# Patient Record
Sex: Female | Born: 1990
Health system: Southern US, Community
[De-identification: ages and names within clinical notes are randomized; demographics above are authoritative.]

## PROBLEM LIST (undated history)

## (undated) DIAGNOSIS — T7840XA Allergy, unspecified, initial encounter: Secondary | ICD-10-CM

## (undated) DIAGNOSIS — Z789 Other specified health status: Secondary | ICD-10-CM

## (undated) DIAGNOSIS — O139 Gestational [pregnancy-induced] hypertension without significant proteinuria, unspecified trimester: Secondary | ICD-10-CM

## (undated) DIAGNOSIS — F419 Anxiety disorder, unspecified: Secondary | ICD-10-CM

## (undated) HISTORY — PX: TONSILLECTOMY: SUR1361

## (undated) HISTORY — DX: Gestational (pregnancy-induced) hypertension without significant proteinuria, unspecified trimester: O13.9

## (undated) HISTORY — PX: WISDOM TOOTH EXTRACTION: SHX21

## (undated) HISTORY — DX: Allergy, unspecified, initial encounter: T78.40XA

---

## 2013-01-26 ENCOUNTER — Ambulatory Visit (INDEPENDENT_AMBULATORY_CARE_PROVIDER_SITE_OTHER): Payer: 59 | Admitting: Family Medicine

## 2013-01-26 ENCOUNTER — Ambulatory Visit: Payer: 59

## 2013-01-26 VITALS — BP 130/82 | HR 108 | Temp 98.7°F | Resp 16 | Ht 64.58 in | Wt 130.0 lb

## 2013-01-26 DIAGNOSIS — R05 Cough: Secondary | ICD-10-CM

## 2013-01-26 DIAGNOSIS — R000 Tachycardia, unspecified: Secondary | ICD-10-CM

## 2013-01-26 LAB — POCT CBC
HCT, POC: 43 % (ref 37.7–47.9)
Hemoglobin: 13.5 g/dL (ref 12.2–16.2)
MCH, POC: 27.8 pg (ref 27–31.2)
MCV: 88.7 fL (ref 80–97)
RBC: 4.85 M/uL (ref 4.04–5.48)
WBC: 6.6 10*3/uL (ref 4.6–10.2)

## 2013-01-26 MED ORDER — AZITHROMYCIN 250 MG PO TABS: ORAL_TABLET | ORAL | Status: DC

## 2013-01-26 MED ORDER — HYDROCODONE-HOMATROPINE 5-1.5 MG/5ML PO SYRP: 5.0000 mL | ORAL_SOLUTION | Freq: Three times a day (TID) | ORAL | Status: DC | PRN

## 2013-01-26 NOTE — Patient Instructions (Addendum)
I will be in touch with you regarding your D dimer test when it comes in later on this evening. If it is positive we will need to do a CT of your chest through the emergency department.    Assuming your D dimer is negative, we will plan to treat you with azithromycin (antibiotic) and you can use the hycodan cough syrup as needed.  Be careful as the cough syrup will make you sleepy.    If you are not feeling better in the next few days please give me a call- Sooner if worse.

## 2013-01-26 NOTE — Progress Notes (Addendum)
[facility] [address] [place] [address] [phone]  Date:  01/26/2013   Name:  Sharon Gardner   DOB:  03-26-1991   MRN:  [id number]  PCP:  No primary provider on file.    Chief Complaint: Cough   History of Present Illness:  Sharon Gardner is a 22 y.o. very pleasant female patient who presents with the following:  She has been coughing for about one week.  She feels as though she might be able to cough up some mucus from her throat, but the cough is actually dry.   She has not noted a fever. She has noted some chest tightness when she is coughing hard.  She has not noted any wheezing, has not noted any CP except for soreness during/ after a severe bout of coughing.    She has had nasal congestion for about one month, and she has had a  ST but no earache.   She had some nausea earlier today. She is having near post- tissuve emesis at times. Not coughing up blood   LMP was about 2 months ago; she uses 3 month continuous OCP.  She is generally healthy and has no significant PMHx.    She took some expectorant/ antitussive medication today.   She does not smoke, has never had a PE or DVT, and has not been immobilized recenlty    There are no active problems to display for this patient.   Past Medical History  Diagnosis Date  . Allergy     No past surgical history on file.  History  Substance Use Topics  . Smoking status: Never Smoker   . Smokeless tobacco: Not on file  . Alcohol Use: Not on file    Family History  Problem Relation Age of Onset  . Cancer Mother   . Cancer Paternal Grandmother     Allergies  Allergen Reactions  . Amoxicillin Rash    Medication list has been reviewed and updated.  No current outpatient prescriptions on file prior to visit.   No current facility-administered medications on file prior to visit.    Review of Systems:  As per HPI- otherwise negative.   Physical Examination: Filed Vitals:   01/26/13 1753  BP: 138/68  Pulse: 118  Resp: 16   Filed Vitals:   01/26/13 1753  Height: 5' 4.58" (1.64 m)  Weight: 130 lb (58.968 kg)   Body mass index is 21.92 kg/(m^2). Ideal Body Weight: Weight in (lb) to have BMI = 25: 148  GEN: WDWN, NAD, Non-toxic, A & O x 3, looks well, coughing some in clinic.  HEENT: Atraumatic, Normocephalic. Neck supple. No masses, No LAD.  Bilateral TM wnl, oropharynx normal.  PEERL,EOMI.   Nasal cavity is inflamed.   Ears and Nose: No external deformity. CV: RRR- tachycardic, No M/G/R. No JVD. No thrill. No extra heart sounds. PULM: CTA B, no wheezes, crackles, rhonchi. No retractions. No resp. distress. No accessory muscle use. ABD: S, NT, ND, +BS. No rebound. No HSM. EXTR: No c/c/e NEURO Normal gait.  PSYCH: Normally interactive. Conversant. Not depressed or anxious appearing.  Calm demeanor.  No calf tenderness or swelling  Results for orders placed in visit on 01/26/13  POCT CBC      Result Value Range   WBC 6.6  4.6 - 10.2 K/uL   Lymph, poc 2.2  0.6 - 3.4   POC LYMPH PERCENT 33.3  10 - 50 %L   MID (cbc) 0.5  0 - 0.9   POC MID % 7.8  0 - 12 %M   POC Granulocyte 0.09 (*) 2 - 6.9   Granulocyte percent 58.9  37 - 80 %G   RBC 4.85  4.04 - 5.48 M/uL   Hemoglobin 13.5  12.2 - 16.2 g/dL   HCT, POC 14.7  82.9 - 47.9 %   MCV 88.7  80 - 97 fL   MCH, POC 27.8  27 - 31.2 pg   MCHC 31.4 (*) 31.8 - 35.4 g/dL   RDW, POC 56.2     Platelet Count, POC 325  142 - 424 K/uL   MPV 9.9  0 - 99.8 fL  POCT URINE PREGNANCY      Result Value Range   Preg Test, Ur Negative      [facility] reading (PRIMARY) by  Dr. Patsy Lager. CXR: negative  CHEST - 2 VIEW  Comparison: None.  Findings: The heart size and mediastinal contours are within normal limits. Both lungs are clear. The visualized skeletal structures are unremarkable.  IMPRESSION: No active cardiopulmonary disease.  Assessment and Plan: Cough - Plan: POCT CBC, POCT urine pregnancy, D-dimer,  quantitative, DG Chest 2 View, azithromycin (ZITHROMAX) 250 MG tablet, HYDROcodone-homatropine (HYCODAN) 5-1.5 MG/5ML syrup  Tachycardia  Sharon Gardner is here today with cough, some mild SOB and tachycardia.  Likely bronchitis.  However, given her persistent tachycardia we will need to perform a D- dimer today to help evaluate her risk of PE.  She understands that if this test is positive she will need a CT angiogram and would like to proceed.  Otherwise will treat with azithromycin, hycodan as needed.  She is aware of possible interaction between OCP and ABX.    Meds ordered this encounter  Medications  . levonorgestrel-ethinyl estradiol (SEASONALE,INTROVALE,JOLESSA) 0.15-0.03 MG tablet    Sig: Take 1 tablet by mouth daily.  Marland Kitchen azithromycin (ZITHROMAX) 250 MG tablet    Sig: Use as a zpack    Dispense:  6 tablet    Refill:  0  . HYDROcodone-homatropine (HYCODAN) 5-1.5 MG/5ML syrup    Sig: Take 5 mLs by mouth every 8 (eight) hours as needed for cough.    Dispense:  90 mL    Refill:  0   Signed Abbe Amsterdam, MD  Received her D dimer result which is negative.  It is at the cut- off level of 0.48, but this is still a negative test (called [facility] to confirm this fact).  Called and gave her the results, and she will let me know if not better in the next couple of days.  She knows she can call at any time if she has concerns or gets worse  01/27/13- she is feeling better and slept well last night.  She will let me know if not continuing to improve

## 2013-01-27 ENCOUNTER — Telehealth: Payer: Self-pay

## 2013-01-27 NOTE — Telephone Encounter (Signed)
Yes. Sorry about that. It has been fixed.

## 2013-01-27 NOTE — Telephone Encounter (Signed)
Message copied by Johnnette Litter on Sat Jan 27, 2013  4:48 PM ------      Message from: Abbe Amsterdam C      Created: Fri Jan 26, 2013  8:37 PM       Please review her % granulocyte on her CBC- is this a typo?            Thanks! ------

## 2013-08-26 ENCOUNTER — Ambulatory Visit (INDEPENDENT_AMBULATORY_CARE_PROVIDER_SITE_OTHER): Payer: 59 | Admitting: Family Medicine

## 2013-08-26 VITALS — BP 116/70 | HR 95 | Temp 98.1°F | Resp 18

## 2013-08-26 DIAGNOSIS — T7840XA Allergy, unspecified, initial encounter: Secondary | ICD-10-CM

## 2013-08-26 MED ORDER — PREDNISONE 20 MG PO TABS: ORAL_TABLET | ORAL | Status: DC

## 2013-08-26 NOTE — Patient Instructions (Signed)

## 2013-08-26 NOTE — Progress Notes (Signed)
° °  Subjective:  This chart was scribed for Elvina Sidle, MD by Carl Best, Medical Scribe. This patient was seen in Room 7 and the patient's care was started at 12:48 PM.  Patient ID: Sharon Gardner, female    DOB: August 08, 1991, 22 y.o.   MRN: [id number]  HPI HPI Comments: Sharon Gardner is a 22 y.o. female who presents to the [facility] complaining of swelling to her eyes bilaterally that started last night caused by an allergic reaction to a new mascara.  The patient states that she applied a hot compress to her eyes with mild relief to her symptoms.  The patient lists eye soreness and eye itchiness as associated symptoms.  She states that she normally wears contacts but has not worn a pair since yesterday when her symptoms started.  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    Past Medical History  Diagnosis Date   Allergy    No past surgical history on file. Family History  Problem Relation Age of Onset   Cancer Mother    Cancer Paternal Grandmother    History   Social History   Marital Status: Single    Spouse Name: N/A    Number of Children: N/A   Years of Education: N/A   Occupational History   Not on file.   Social History Main Topics   Smoking status: Never Smoker    Smokeless tobacco: Not on file   Alcohol Use: Not on file   Drug Use: Not on file   Sexual Activity: Yes    Birth Control/ Protection: Pill   Other Topics Concern   Not on file   Social History Narrative   No narrative on file   Allergies  Allergen Reactions   Amoxicillin Rash     Review of Systems  Eyes: Positive for pain and itching.  All other systems reviewed and are negative.      Objective:  Physical Exam Markedly swollen upper and lower lids with erythema diffusely. Extraocular motion intact, pupils equal and reactive, mild injection of the sclera. Oropharynx: Clear Neck: Supple without  adenopathy, no pretragal adenopathy  Assessment & Plan:   I personally performed the services described in this documentation, which was scribed in my presence. The recorded information has been reviewed and is accurate.  Allergic reaction, initial encounter - Plan: predniSONE (DELTASONE) 20 MG tablet Cool compresses OOW today Signed, Elvina Sidle, MD

## 2016-04-30 ENCOUNTER — Inpatient Hospital Stay (HOSPITAL_COMMUNITY)
Admission: AD | Admit: 2016-04-30 | Discharge: 2016-04-30 | Disposition: A | Discharge status: Home / Self Care | Payer: BLUE CROSS/BLUE SHIELD | Source: Ambulatory Visit | Attending: Obstetrics and Gynecology | Admitting: Obstetrics and Gynecology

## 2016-04-30 ENCOUNTER — Inpatient Hospital Stay (HOSPITAL_COMMUNITY): Payer: BLUE CROSS/BLUE SHIELD

## 2016-04-30 ENCOUNTER — Encounter (HOSPITAL_COMMUNITY): Payer: Self-pay | Admitting: *Deleted

## 2016-04-30 DIAGNOSIS — Z3A01 Less than 8 weeks gestation of pregnancy: Secondary | ICD-10-CM | POA: Insufficient documentation

## 2016-04-30 DIAGNOSIS — O3680X0 Pregnancy with inconclusive fetal viability, not applicable or unspecified: Secondary | ICD-10-CM

## 2016-04-30 DIAGNOSIS — R109 Unspecified abdominal pain: Secondary | ICD-10-CM | POA: Diagnosis present

## 2016-04-30 DIAGNOSIS — O4691 Antepartum hemorrhage, unspecified, first trimester: Secondary | ICD-10-CM | POA: Diagnosis not present

## 2016-04-30 DIAGNOSIS — O209 Hemorrhage in early pregnancy, unspecified: Secondary | ICD-10-CM | POA: Diagnosis not present

## 2016-04-30 DIAGNOSIS — N939 Abnormal uterine and vaginal bleeding, unspecified: Secondary | ICD-10-CM | POA: Diagnosis present

## 2016-04-30 HISTORY — DX: Other specified health status: Z78.9

## 2016-04-30 LAB — CBC
HEMATOCRIT: 40.3 % (ref 36.0–46.0)
HEMOGLOBIN: 14 g/dL (ref 12.0–15.0)
MCH: 29 pg (ref 26.0–34.0)
MCHC: 34.7 g/dL (ref 30.0–36.0)
MCV: 83.6 fL (ref 78.0–100.0)
Platelets: 264 10*3/uL (ref 150–400)
RBC: 4.82 MIL/uL (ref 3.87–5.11)
RDW: 13 % (ref 11.5–15.5)
WBC: 7 10*3/uL (ref 4.0–10.5)

## 2016-04-30 LAB — POCT PREGNANCY, URINE: PREG TEST UR: POSITIVE — AB

## 2016-04-30 LAB — URINALYSIS, ROUTINE W REFLEX MICROSCOPIC
BILIRUBIN URINE: NEGATIVE
Glucose, UA: NEGATIVE mg/dL
Ketones, ur: NEGATIVE mg/dL
Leukocytes, UA: NEGATIVE
Nitrite: NEGATIVE
PROTEIN: NEGATIVE mg/dL
Specific Gravity, Urine: 1.01 (ref 1.005–1.030)
pH: 8 (ref 5.0–8.0)

## 2016-04-30 LAB — URINE MICROSCOPIC-ADD ON
Bacteria, UA: NONE SEEN
RBC / HPF: NONE SEEN RBC/hpf (ref 0–5)
WBC, UA: NONE SEEN WBC/hpf (ref 0–5)

## 2016-04-30 LAB — ABO/RH: ABO/RH(D): AB POS

## 2016-04-30 LAB — HCG, QUANTITATIVE, PREGNANCY: HCG, BETA CHAIN, QUANT, S: 66 m[IU]/mL — AB (ref <5)

## 2016-04-30 NOTE — MAU Provider Note (Signed)
History     CSN: [id number]  Arrival date and time: 04/30/16 1631   First Provider Initiated Contact with Patient 04/30/16 1737      Chief Complaint  Patient presents with  . Abdominal Cramping  . Vaginal Bleeding   HPI   Ms.Lumina Giannuzzi is a 25 y.o. female with a history of PCOS, G1P0 @ [redacted]w[redacted]d here with vaginal bleeding and abdominal pain. The symptoms started on Monday; the symptoms started with brown spotting. On Tuesday the bleeding was dark red and heavier. It has waxed and waned throughout the week. She has not noticed any spotting or bleeding today. She also at the time of bleeding had mild,  lower abdominal cramping. She never took any over the counter pain medication.   Currently she denies pain. Currently the bleeding is minimal.   OB History    Gravida Para Term Preterm AB Living   1         0   SAB TAB Ectopic Multiple Live Births                  Past Medical History:  Diagnosis Date  . Allergy   . Medical history non-contributory     Past Surgical History:  Procedure Laterality Date  . TONSILLECTOMY      Family History  Problem Relation Age of Onset  . Cancer Mother   . Cancer Paternal Grandmother     Social History  Substance Use Topics  . Smoking status: Never Smoker  . Smokeless tobacco: Never Used  . Alcohol use No    Allergies:  Allergies  Allergen Reactions  . Amoxicillin Rash    Has patient had a PCN reaction causing immediate rash, facial/tongue/throat swelling, SOB or lightheadedness with hypotension: No Has patient had a PCN reaction causing severe rash involving mucus membranes or skin necrosis: No Has patient had a PCN reaction that required hospitalization No Has patient had a PCN reaction occurring within the last 10 years: Yes If all of the above answers are "NO", then may proceed with Cephalosporin use.     Prescriptions Prior to Admission  Medication Sig Dispense Refill Last Dose  . metFORMIN (GLUCOPHAGE) 1000 MG  tablet Take 500 mg by mouth daily with breakfast.   04/30/2016 at Unknown time  . Prenatal Vit-Fe Fumarate-FA (PRENATAL MULTIVITAMIN) TABS tablet Take 1 tablet by mouth daily at 12 noon.   04/30/2016 at Unknown time  . predniSONE (DELTASONE) 20 MG tablet 2 daily with food (Patient not taking: Reported on 04/30/2016) 10 tablet 1 Not Taking at Unknown time   Results for orders placed or performed during the hospital encounter of 04/30/16 (from the past 48 hour(s))  Urinalysis, Routine w reflex microscopic (not at [facility])     Status: Abnormal   Collection Time: 04/30/16  4:45 PM  Result Value Ref Range   Color, Urine STRAW (A) YELLOW   APPearance CLEAR CLEAR   Specific Gravity, Urine 1.010 1.005 - 1.030   pH 8.0 5.0 - 8.0   Glucose, UA NEGATIVE NEGATIVE mg/dL   Hgb urine dipstick SMALL (A) NEGATIVE   Bilirubin Urine NEGATIVE NEGATIVE   Ketones, ur NEGATIVE NEGATIVE mg/dL   Protein, ur NEGATIVE NEGATIVE mg/dL   Nitrite NEGATIVE NEGATIVE   Leukocytes, UA NEGATIVE NEGATIVE  Urine microscopic-add on     Status: Abnormal   Collection Time: 04/30/16  4:45 PM  Result Value Ref Range   Squamous Epithelial / LPF 0-5 (A) NONE SEEN   WBC, UA NONE  SEEN 0 - 5 WBC/hpf   RBC / HPF NONE SEEN 0 - 5 RBC/hpf   Bacteria, UA NONE SEEN NONE SEEN  Pregnancy, urine POC     Status: Abnormal   Collection Time: 04/30/16  5:09 PM  Result Value Ref Range   Preg Test, Ur POSITIVE (A) NEGATIVE    Comment:        THE SENSITIVITY OF THIS METHODOLOGY IS >24 mIU/mL   CBC     Status: None   Collection Time: 04/30/16  5:56 PM  Result Value Ref Range   WBC 7.0 4.0 - 10.5 K/uL   RBC 4.82 3.87 - 5.11 MIL/uL   Hemoglobin 14.0 12.0 - 15.0 g/dL   HCT 40.3 36.0 - 46.0 %   MCV 83.6 78.0 - 100.0 fL   MCH 29.0 26.0 - 34.0 pg   MCHC 34.7 30.0 - 36.0 g/dL   RDW 13.0 11.5 - 15.5 %   Platelets 264 150 - 400 K/uL  ABO/Rh     Status: None (Preliminary result)   Collection Time: 04/30/16  5:56 PM  Result Value Ref Range    ABO/RH(D) AB POS   hCG, quantitative, pregnancy     Status: Abnormal   Collection Time: 04/30/16  5:56 PM  Result Value Ref Range   hCG, Beta Chain, Quant, S 66 (H) <5 mIU/mL    Comment:          GEST. AGE      CONC.  (mIU/mL)   <=1 WEEK        5 - 50     2 WEEKS       50 - 500     3 WEEKS       100 - 10,000     4 WEEKS     1,000 - 30,000     5 WEEKS     3,500 - 115,000   6-8 WEEKS     12,000 - 270,000    12 WEEKS     15,000 - 220,000        FEMALE AND NON-PREGNANT FEMALE:     LESS THAN 5 mIU/mL     Review of Systems  Constitutional: Negative for chills and fever.  Gastrointestinal: Negative for abdominal pain, nausea and vomiting.  Genitourinary: Negative for dysuria.   Physical Exam   Blood pressure 127/92, pulse 80, temperature 97.6 F (36.4 C), temperature source Oral, resp. rate 16, height 5' 4.25" (1.632 m), weight 128 lb 1 oz (58.1 kg), last menstrual period 03/11/2016.  Physical Exam  Constitutional: She is oriented to person, place, and time. She appears well-developed and well-nourished. No distress.  HENT:  Head: Normocephalic.  Eyes: Pupils are equal, round, and reactive to light.  Neck: Neck supple.  GI: Soft. She exhibits no distension. There is no tenderness. There is no rebound.  Genitourinary:  Genitourinary Comments: Speculum exam: Vagina - Small-moderate amount of dark brown discharge Cervix - No contact bleeding, no active bleeding Bimanual exam: Cervix closed Uterus non tender, normal size Adnexa non tender, no masses bilaterally Chaperone present for exam.  Musculoskeletal: Normal range of motion.  Neurological: She is oriented to person, place, and time.  Skin: Skin is warm. She is not diaphoretic.  Psychiatric: Her behavior is normal.    MAU Course  Procedures  None  MDM  CBC ABO: AB positive Hcg [place]  Discussed HPI> exam> results with Dr. Melba Coon.  Discussed results of labs and [place] in detail with the patient.  Assessment and Plan    A:  1. Vaginal bleeding in pregnancy, first trimester   2. Pregnancy of unknown anatomic location     P:  Discharge home in stable condition Return to MAU in 48 hours for repeat Quant, or sooner if symptoms worsen Bleeding precautions Pelvic rest Support given.    Lezlie Lye, NP 04/30/2016 7:31 PM

## 2016-04-30 NOTE — MAU Note (Signed)
Patient presents with bleeding and cramping x 4 days, usually sees when wiping when urinating, not wearing a pad, positive home pregnancy test.

## 2016-04-30 NOTE — Discharge Instructions (Signed)
Pelvic Rest Pelvic rest is sometimes recommended for women when:   The placenta is partially or completely covering the opening of the cervix (placenta previa).  There is bleeding between the uterine wall and the amniotic sac in the first trimester (subchorionic hemorrhage).  The cervix begins to open without labor starting (incompetent cervix, cervical insufficiency).  The labor is too early (preterm labor). HOME CARE INSTRUCTIONS  Do not have sexual intercourse, stimulation, or an orgasm.  Do not use tampons, douche, or put anything in the vagina.  Do not lift anything over 10 pounds (4.5 kg).  Avoid strenuous activity or straining your pelvic muscles. SEEK MEDICAL CARE IF:  You have any vaginal bleeding during pregnancy. Treat this as a potential emergency.  You have cramping pain felt low in the stomach (stronger than menstrual cramps).  You notice vaginal discharge (watery, mucus, or bloody).  You have a low, dull backache.  There are regular contractions or uterine tightening. SEEK IMMEDIATE MEDICAL CARE IF: You have vaginal bleeding and have placenta previa.    This information is not intended to replace advice given to you by your health care provider. Make sure you discuss any questions you have with your health care provider.   Document Released: 01/01/2011 Document Revised: 11/29/2011 Document Reviewed: 03/10/2015 Elsevier Interactive Patient Education [employer].

## 2016-05-02 ENCOUNTER — Inpatient Hospital Stay (HOSPITAL_COMMUNITY)
Admission: AD | Admit: 2016-05-02 | Discharge: 2016-05-02 | Disposition: A | Discharge status: Home Health | Payer: BLUE CROSS/BLUE SHIELD | Source: Ambulatory Visit | Attending: Obstetrics & Gynecology | Admitting: Obstetrics & Gynecology

## 2016-05-02 DIAGNOSIS — O0281 Inappropriate change in quantitative human chorionic gonadotropin (hCG) in early pregnancy: Secondary | ICD-10-CM | POA: Insufficient documentation

## 2016-05-02 DIAGNOSIS — O039 Complete or unspecified spontaneous abortion without complication: Secondary | ICD-10-CM

## 2016-05-02 DIAGNOSIS — Z3A01 Less than 8 weeks gestation of pregnancy: Secondary | ICD-10-CM | POA: Insufficient documentation

## 2016-05-02 LAB — HCG, QUANTITATIVE, PREGNANCY: HCG, BETA CHAIN, QUANT, S: 32 m[IU]/mL — AB (ref <5)

## 2016-05-02 NOTE — MAU Provider Note (Signed)
Ms. Sharon Gardner  is a 25 y.o. G1P0 at [redacted]w[redacted]d who presents to MAU today for follow-up quant hCG after 48 hours.  She was seen on 8/11 with abdominal pain and vaginal bleeding.  Currently she is without complaints. She denies pain, she denies bleeding.   BP 117/72 (BP Location: Right Arm)   Pulse 77   Temp 97.9 F (36.6 C) (Oral)   Resp 16   LMP 03/11/2016   CONSTITUTIONAL: Well-developed, well-nourished female in no acute distress.  MUSCULOSKELETAL: Normal range of motion.  CARDIOVASCULAR: Regular heart rate RESPIRATORY: Normal effort NEUROLOGICAL: Alert and oriented to person, place, and time.  PSYCH: Normal mood and affect. Normal behavior. Normal judgment and thought content.  MDM:   Beta hcg level on 8/11- 66 Beta hcg level on 8/13- 32 Discussed labs with Dr. Melba Coon    A: In-appropriate rise in quant hCG after 48 hours; concerning for SAB  P: Discharge home Patient will follow up on Thursday with [facility].  Patient may return to MAU as needed or if her condition were to change or worsen  Support given   Lezlie Lye, NP 05/02/2016 5:42 PM

## 2016-05-02 NOTE — Discharge Instructions (Signed)
Miscarriage  A miscarriage is the sudden loss of an unborn baby (fetus) before the 20th week of pregnancy. Most miscarriages happen in the first 3 months of pregnancy. Sometimes, it happens before a woman even knows she is pregnant. A miscarriage is also called a "spontaneous miscarriage" or "early pregnancy loss." Having a miscarriage can be an emotional experience. Talk with your caregiver about any questions you may have about miscarrying, the grieving process, and your future pregnancy plans.  CAUSES    Problems with the fetal chromosomes that make it impossible for the baby to develop normally. Problems with the baby's genes or chromosomes are most often the result of errors that occur, by chance, as the embryo divides and grows. The problems are not inherited from the parents.   Infection of the cervix or uterus.    Hormone problems.    Problems with the cervix, such as having an incompetent cervix. This is when the tissue in the cervix is not strong enough to hold the pregnancy.    Problems with the uterus, such as an abnormally shaped uterus, uterine fibroids, or congenital abnormalities.    Certain medical conditions.    Smoking, drinking alcohol, or taking illegal drugs.    Trauma.   Often, the cause of a miscarriage is unknown.   SYMPTOMS    Vaginal bleeding or spotting, with or without cramps or pain.   Pain or cramping in the abdomen or lower back.   Passing fluid, tissue, or blood clots from the vagina.  DIAGNOSIS   Your caregiver will perform a physical exam. You may also have an ultrasound to confirm the miscarriage. Blood or urine tests may also be ordered.  TREATMENT    Sometimes, treatment is not necessary if you naturally pass all the fetal tissue that was in the uterus. If some of the fetus or placenta remains in the body (incomplete miscarriage), tissue left behind may become infected and must be removed. Usually, a dilation and curettage (D and C) procedure is performed.  During a D and C procedure, the cervix is widened (dilated) and any remaining fetal or placental tissue is gently removed from the uterus.   Antibiotic medicines are prescribed if there is an infection. Other medicines may be given to reduce the size of the uterus (contract) if there is a lot of bleeding.   If you have Rh negative blood and your baby was Rh positive, you will need a Rh immunoglobulin shot. This shot will protect any future baby from having Rh blood problems in future pregnancies.  HOME CARE INSTRUCTIONS    Your caregiver may order bed rest or may allow you to continue light activity. Resume activity as directed by your caregiver.   Have someone help with home and family responsibilities during this time.    Keep track of the number of sanitary pads you use each day and how soaked (saturated) they are. Write down this information.    Do not use tampons. Do not douche or have sexual intercourse until approved by your caregiver.    Only take over-the-counter or prescription medicines for pain or discomfort as directed by your caregiver.    Do not take aspirin. Aspirin can cause bleeding.    Keep all follow-up appointments with your caregiver.    If you or your partner have problems with grieving, talk to your caregiver or seek counseling to help cope with the pregnancy loss. Allow enough time to grieve before trying to get pregnant again.     SEEK IMMEDIATE MEDICAL CARE IF:    You have severe cramps or pain in your back or abdomen.   You have a fever.   You pass large blood clots (walnut-sized or larger) ortissue from your vagina. Save any tissue for your caregiver to inspect.    Your bleeding increases.    You have a thick, bad-smelling vaginal discharge.   You become lightheaded, weak, or you faint.    You have chills.   MAKE SURE YOU:   Understand these instructions.   Will watch your condition.   Will get help right away if you are not doing well or get worse.     This  information is not intended to replace advice given to you by your health care provider. Make sure you discuss any questions you have with your health care provider.     Document Released: 03/02/2001 Document Revised: 01/01/2013 Document Reviewed: 10/26/2011  Elsevier Interactive Patient Education 2016 Elsevier Inc.

## 2016-05-02 NOTE — MAU Note (Signed)
Nervous., doing ok.  Little bit of bleeding on Friday, none since.  No longer cramping.

## 2016-07-30 LAB — OB RESULTS CONSOLE HIV ANTIBODY (ROUTINE TESTING): HIV: NONREACTIVE

## 2016-07-30 LAB — OB RESULTS CONSOLE GC/CHLAMYDIA
Chlamydia: NEGATIVE
GC PROBE AMP, GENITAL: NEGATIVE

## 2016-07-30 LAB — OB RESULTS CONSOLE RUBELLA ANTIBODY, IGM: Rubella: IMMUNE

## 2016-07-30 LAB — OB RESULTS CONSOLE ANTIBODY SCREEN: ANTIBODY SCREEN: NEGATIVE

## 2016-07-30 LAB — OB RESULTS CONSOLE HEPATITIS B SURFACE ANTIGEN: Hepatitis B Surface Ag: NEGATIVE

## 2016-07-30 LAB — OB RESULTS CONSOLE RPR: RPR: NONREACTIVE

## 2016-07-30 LAB — OB RESULTS CONSOLE ABO/RH: RH TYPE: POSITIVE

## 2017-01-28 LAB — OB RESULTS CONSOLE GBS: STREP GROUP B AG: NEGATIVE

## 2017-02-07 ENCOUNTER — Encounter (HOSPITAL_COMMUNITY): Payer: Self-pay | Admitting: *Deleted

## 2017-02-07 ENCOUNTER — Telehealth (HOSPITAL_COMMUNITY): Payer: Self-pay | Admitting: *Deleted

## 2017-02-07 NOTE — Telephone Encounter (Signed)
Preadmission screen  

## 2017-02-09 ENCOUNTER — Encounter (HOSPITAL_COMMUNITY): Payer: Self-pay | Admitting: *Deleted

## 2017-02-09 ENCOUNTER — Telehealth (HOSPITAL_COMMUNITY): Payer: Self-pay | Admitting: *Deleted

## 2017-02-09 MED ORDER — LABETALOL HCL 5 MG/ML IV SOLN
20.0000 mg | INTRAVENOUS | Status: DC | PRN
  Filled 2017-02-09: qty 16

## 2017-02-09 MED ORDER — HYDRALAZINE HCL 20 MG/ML IJ SOLN
10.0000 mg | Freq: Once | INTRAMUSCULAR | Status: DC | PRN
  Filled 2017-02-09: qty 0.5

## 2017-02-09 NOTE — Telephone Encounter (Signed)
Preadmission screen  

## 2017-02-09 NOTE — H&P (Signed)
Sharon Gardner is a 26 y.o. female G2P0010 at 55 2/7 weeks (EDD 02/28/17 by 6 week [place]) presenting for IOL for gestational hypertension.  Pt began to have BP elevations at 30 weeks and has been followed closely with labs, NST's and twice weekly visits.  She has exhibited no signs of preeclampsia.   Prenatal care otherwise uneventful.  OB History    Gravida Para Term Preterm AB Living   2 0     1 0   SAB TAB Ectopic Multiple Live Births   1            SAB x 1  Past Medical History:  Diagnosis Date  . Allergy   . Medical history non-contributory   . Pregnancy induced hypertension    Past Surgical History:  Procedure Laterality Date  . TONSILLECTOMY    . WISDOM TOOTH EXTRACTION     Family History: family history includes Cancer in her mother and paternal grandmother. Social History:  reports that she has never smoked. She has never used smokeless tobacco. She reports that she does not drink alcohol or use drugs.     Maternal Diabetes: No Genetic Screening: Normal Maternal Ultrasounds/Referrals: Normal Fetal Ultrasounds or other Referrals:  None Maternal Substance Abuse:  No Significant Maternal Medications:  None Significant Maternal Lab Results:  None Other Comments:  None  Review of Systems  Gastrointestinal: Negative for abdominal pain.  Neurological: Negative for headaches.   Maternal Medical History:  Contractions: Frequency: rare.   Perceived severity is mild.    Fetal activity: Perceived fetal activity is normal.    Prenatal complications: PIH.   Prenatal Complications - Diabetes: none.      Last menstrual period 03/11/2016, unknown if currently breastfeeding. Maternal Exam:  Uterine Assessment: Contraction strength is mild.  Contraction frequency is rare.   Abdomen: Patient reports no abdominal tenderness. Fetal presentation: vertex  Introitus: Normal vulva. Normal vagina.    Physical Exam  Constitutional: She appears well-developed and  well-nourished.  Cardiovascular: Normal rate and regular rhythm.   Respiratory: Effort normal.  GI: Soft.  Genitourinary: Vagina normal.  Neurological: She is alert.  Psychiatric: She has a normal mood and affect.    Prenatal labs: ABO, Rh: AB/Positive/-- (11/10 0000) Antibody: Negative (11/10 0000) Rubella: Immune (11/10 0000) RPR: Nonreactive (11/10 0000)  HBsAg: Negative (11/10 0000)  HIV: Non-reactive (11/10 0000)  GBS: Negative (05/11 0000)  First trimester screen and AFP negative One Hour GCT 85 Essential panel negative  Assessment/Plan: Pt admitted for ripening and IOL for gestational hypertension.  Will repeat labs on admission, but thus far no s/s of preeclampsia.    Treat BP as needed.    Logan Bores 02/09/2017, 6:20 PM

## 2017-02-10 ENCOUNTER — Inpatient Hospital Stay (HOSPITAL_COMMUNITY)
Admission: RE | Admit: 2017-02-10 | Discharge: 2017-02-13 | DRG: 766 | Disposition: A | Discharge status: Home / Self Care | Payer: BLUE CROSS/BLUE SHIELD | Source: Ambulatory Visit | Attending: Obstetrics and Gynecology | Admitting: Obstetrics and Gynecology

## 2017-02-10 ENCOUNTER — Inpatient Hospital Stay (HOSPITAL_COMMUNITY): Payer: BLUE CROSS/BLUE SHIELD | Admitting: Anesthesiology

## 2017-02-10 ENCOUNTER — Encounter (HOSPITAL_COMMUNITY): Payer: Self-pay

## 2017-02-10 DIAGNOSIS — Z8759 Personal history of other complications of pregnancy, childbirth and the puerperium: Secondary | ICD-10-CM | POA: Diagnosis present

## 2017-02-10 DIAGNOSIS — Z3A37 37 weeks gestation of pregnancy: Secondary | ICD-10-CM

## 2017-02-10 DIAGNOSIS — Z98891 History of uterine scar from previous surgery: Secondary | ICD-10-CM

## 2017-02-10 DIAGNOSIS — O134 Gestational [pregnancy-induced] hypertension without significant proteinuria, complicating childbirth: Secondary | ICD-10-CM | POA: Diagnosis present

## 2017-02-10 LAB — CBC
HCT: 32.1 % — ABNORMAL LOW (ref 36.0–46.0)
HEMATOCRIT: 32.2 % — AB (ref 36.0–46.0)
Hemoglobin: 11.2 g/dL — ABNORMAL LOW (ref 12.0–15.0)
Hemoglobin: 11.1 g/dL — ABNORMAL LOW (ref 12.0–15.0)
MCH: 29.7 pg (ref 26.0–34.0)
MCH: 29.8 pg (ref 26.0–34.0)
MCHC: 34.9 g/dL (ref 30.0–36.0)
MCHC: 34.5 g/dL (ref 30.0–36.0)
MCV: 85.1 fL (ref 78.0–100.0)
MCV: 86.3 fL (ref 78.0–100.0)
PLATELETS: 147 10*3/uL — AB (ref 150–400)
PLATELETS: 135 10*3/uL — AB (ref 150–400)
RBC: 3.77 MIL/uL — AB (ref 3.87–5.11)
RBC: 3.73 MIL/uL — AB (ref 3.87–5.11)
RDW: 13.1 % (ref 11.5–15.5)
RDW: 13.2 % (ref 11.5–15.5)
WBC: 13.6 10*3/uL — AB (ref 4.0–10.5)
WBC: 13 10*3/uL — AB (ref 4.0–10.5)

## 2017-02-10 LAB — COMPREHENSIVE METABOLIC PANEL
ALT: 9 U/L — ABNORMAL LOW (ref 14–54)
AST: 19 U/L (ref 15–41)
Albumin: 3 g/dL — ABNORMAL LOW (ref 3.5–5.0)
Alkaline Phosphatase: 108 U/L (ref 38–126)
Anion gap: 9 (ref 5–15)
BUN: 8 mg/dL (ref 6–20)
CHLORIDE: 105 mmol/L (ref 101–111)
CO2: 21 mmol/L — ABNORMAL LOW (ref 22–32)
Calcium: 9 mg/dL (ref 8.9–10.3)
Creatinine, Ser: 0.56 mg/dL (ref 0.44–1.00)
GFR calc non Af Amer: >60 mL/min (ref >60)
Glucose, Bld: 80 mg/dL (ref 65–99)
POTASSIUM: 3.2 mmol/L — AB (ref 3.5–5.1)
Sodium: 135 mmol/L (ref 135–145)
Total Bilirubin: 0.2 mg/dL — ABNORMAL LOW (ref 0.3–1.2)
Total Protein: 6.4 g/dL — ABNORMAL LOW (ref 6.5–8.1)

## 2017-02-10 LAB — TYPE AND SCREEN
ABO/RH(D): AB POS
Antibody Screen: NEGATIVE

## 2017-02-10 LAB — PROTEIN / CREATININE RATIO, URINE
CREATININE, URINE: 118 mg/dL
Protein Creatinine Ratio: 0.29 mg/mg{Cre} — ABNORMAL HIGH (ref 0.00–0.15)
TOTAL PROTEIN, URINE: 34 mg/dL

## 2017-02-10 LAB — RPR: RPR: NONREACTIVE

## 2017-02-10 MED ORDER — OXYCODONE-ACETAMINOPHEN 5-325 MG PO TABS: 1.0000 | ORAL_TABLET | ORAL | Status: DC | PRN

## 2017-02-10 MED ORDER — OXYTOCIN 40 UNITS IN LACTATED RINGERS INFUSION - SIMPLE MED
1.0000 m[IU]/min | INTRAVENOUS | Status: DC
  Administered 2017-02-10: 2 m[IU]/min via INTRAVENOUS

## 2017-02-10 MED ORDER — OXYTOCIN 40 UNITS IN LACTATED RINGERS INFUSION - SIMPLE MED
2.5000 [IU]/h | INTRAVENOUS | Status: DC
  Filled 2017-02-10: qty 1000

## 2017-02-10 MED ORDER — LACTATED RINGERS IV SOLN
INTRAVENOUS | Status: DC
  Administered 2017-02-10 – 2017-02-11 (×4): via INTRAVENOUS

## 2017-02-10 MED ORDER — EPHEDRINE 5 MG/ML INJ: 10.0000 mg | INTRAVENOUS | Status: DC | PRN

## 2017-02-10 MED ORDER — LIDOCAINE HCL (PF) 1 % IJ SOLN: 30.0000 mL | INTRAMUSCULAR | Status: DC | PRN

## 2017-02-10 MED ORDER — FENTANYL 2.5 MCG/ML BUPIVACAINE 1/10 % EPIDURAL INFUSION (WH - ANES)
14.0000 mL/h | INTRAMUSCULAR | Status: DC | PRN
  Administered 2017-02-10 – 2017-02-11 (×3): 14 mL/h via EPIDURAL

## 2017-02-10 MED ORDER — BUTORPHANOL TARTRATE 1 MG/ML IJ SOLN: 1.0000 mg | INTRAMUSCULAR | Status: DC | PRN

## 2017-02-10 MED ORDER — ONDANSETRON HCL 4 MG/2ML IJ SOLN: 4.0000 mg | Freq: Four times a day (QID) | INTRAMUSCULAR | Status: DC | PRN

## 2017-02-10 MED ORDER — FENTANYL 2.5 MCG/ML BUPIVACAINE 1/10 % EPIDURAL INFUSION (WH - ANES)
14.0000 mL/h | INTRAMUSCULAR | Status: DC | PRN
  Filled 2017-02-10 (×3): qty 100

## 2017-02-10 MED ORDER — OXYCODONE-ACETAMINOPHEN 5-325 MG PO TABS: 2.0000 | ORAL_TABLET | ORAL | Status: DC | PRN

## 2017-02-10 MED ORDER — DIPHENHYDRAMINE HCL 50 MG/ML IJ SOLN: 12.5000 mg | INTRAMUSCULAR | Status: DC | PRN

## 2017-02-10 MED ORDER — LACTATED RINGERS IV SOLN: 500.0000 mL | Freq: Once | INTRAVENOUS | Status: DC

## 2017-02-10 MED ORDER — PHENYLEPHRINE 40 MCG/ML (10ML) SYRINGE FOR IV PUSH (FOR BLOOD PRESSURE SUPPORT)
80.0000 ug | PREFILLED_SYRINGE | INTRAVENOUS | Status: DC | PRN
  Filled 2017-02-10: qty 10

## 2017-02-10 MED ORDER — PHENYLEPHRINE 40 MCG/ML (10ML) SYRINGE FOR IV PUSH (FOR BLOOD PRESSURE SUPPORT): 80.0000 ug | PREFILLED_SYRINGE | INTRAVENOUS | Status: DC | PRN

## 2017-02-10 MED ORDER — LACTATED RINGERS IV SOLN: 500.0000 mL | INTRAVENOUS | Status: DC | PRN

## 2017-02-10 MED ORDER — SOD CITRATE-CITRIC ACID 500-334 MG/5ML PO SOLN
30.0000 mL | ORAL | Status: DC | PRN
  Administered 2017-02-11: 30 mL via ORAL
  Filled 2017-02-10: qty 15

## 2017-02-10 MED ORDER — ACETAMINOPHEN 325 MG PO TABS: 650.0000 mg | ORAL_TABLET | ORAL | Status: DC | PRN

## 2017-02-10 MED ORDER — LIDOCAINE HCL (PF) 1 % IJ SOLN
INTRAMUSCULAR | Status: DC | PRN
  Administered 2017-02-10 (×2): 5 mL via EPIDURAL

## 2017-02-10 MED ORDER — OXYTOCIN BOLUS FROM INFUSION: 500.0000 mL | Freq: Once | INTRAVENOUS | Status: DC

## 2017-02-10 MED ORDER — TERBUTALINE SULFATE 1 MG/ML IJ SOLN: 0.2500 mg | Freq: Once | INTRAMUSCULAR | Status: DC | PRN

## 2017-02-10 MED ORDER — MISOPROSTOL 25 MCG QUARTER TABLET
25.0000 ug | ORAL_TABLET | ORAL | Status: AC | PRN
  Administered 2017-02-10 (×2): 25 ug via VAGINAL
  Filled 2017-02-10 (×2): qty 1

## 2017-02-10 NOTE — Anesthesia Procedure Notes (Signed)
Epidural Patient location during procedure: OB Start time: 02/10/2017 11:07 AM End time: 02/10/2017 11:16 AM  Staffing Anesthesiologist: Marcie Bal, Rylin Saez Performed: anesthesiologist   Preanesthetic Checklist Completed: patient identified, site marked, pre-op evaluation, timeout performed, IV checked, risks and benefits discussed and monitors and equipment checked  Epidural Patient position: sitting Prep: DuraPrep Patient monitoring: heart rate, cardiac monitor, continuous pulse ox and blood pressure Approach: midline Location: L2-L3 Injection technique: LOR saline  Needle:  Needle type: Tuohy  Needle gauge: 17 G Needle length: 9 cm Needle insertion depth: 6 cm Catheter type: closed end flexible Catheter size: 19 Gauge Catheter at skin depth: 11 cm Test dose: negative and Other  Assessment Events: blood not aspirated, injection not painful, no injection resistance and negative IV test  Additional Notes Informed consent obtained prior to proceeding including risk of failure, 1% risk of PDPH, risk of minor discomfort and bruising.  Discussed rare but serious complications including epidural abscess, permanent nerve injury, epidural hematoma.  Discussed alternatives to epidural analgesia and patient desires to proceed.  Timeout performed pre-procedure verifying patient name, procedure, and platelet count.  Patient tolerated procedure well. Reason for block:procedure for pain

## 2017-02-10 NOTE — Anesthesia Pain Management Evaluation Note (Signed)
  CRNA Pain Management Visit Note  Patient: Sharon Gardner, 26 y.o., female  "Hello I am a member of the anesthesia team at [facility]. We have an anesthesia team available at all times to provide care throughout the hospital, including epidural management and anesthesia for C-section. I don't know your plan for the delivery whether it a natural birth, water birth, IV sedation, nitrous supplementation, doula or epidural, but we want to meet your pain goals."   1.Was your pain managed to your expectations on prior hospitalizations?   No prior hospitalizations  2.What is your expectation for pain management during this hospitalization?     Epidural  3.How can we help you reach that goal? epidural  Record the patient's initial score and the patient's pain goal.   Pain: 4  Pain Goal: 7 [facility] wants you to be able to say your pain was always managed very well.  Willa Rough 02/10/2017

## 2017-02-10 NOTE — Progress Notes (Signed)
Patient ID: Sharon Gardner, female   DOB: 06/03/1991, 26 y.o.   MRN: [id number] Pt comfortable with epidural  afeb BP 150/90 highest  Cervix 80/3/-1  IUPC adequate

## 2017-02-10 NOTE — Progress Notes (Signed)
Patient ID: Sharon Gardner, female   DOB: [birth year], 26 y.o.   MRN: [id number] Pt feeling mild contractions.  Received cytotec x 2, last dose at 0600  BP 150/90's FHR category 1  Cervix 1-2/50/-2 AROM clear  Labs WNL, prot:creat ratio .29 Will repeat CBC when ready for epidural

## 2017-02-10 NOTE — Progress Notes (Signed)
Patient ID: Sharon Gardner, female   DOB: 10/02/1990, 26 y.o.   MRN: [id number] Pt feeling a little crampy  BP 140/80-160/90  Cervix 80/4/-1  D/w pt latent phase labor and not uncommon to take this long to get active IUPC in place and MVU adequate Will follow progress.

## 2017-02-10 NOTE — Anesthesia Preprocedure Evaluation (Signed)
Anesthesia Evaluation  Patient identified by MRN, date of birth, ID band Patient awake    Reviewed: Allergy & Precautions, H&P , NPO status , Patient's Chart, lab work & pertinent test results  Airway Mallampati: II   Neck ROM: full    Dental   Pulmonary neg pulmonary ROS,    breath sounds clear to auscultation       Cardiovascular hypertension,  Rhythm:regular Rate:Normal     Neuro/Psych    GI/Hepatic   Endo/Other    Renal/GU      Musculoskeletal   Abdominal   Peds  Hematology   Anesthesia Other Findings   Reproductive/Obstetrics (+) Pregnancy                             Anesthesia Physical Anesthesia Plan  ASA: II  Anesthesia Plan: Epidural   Post-op Pain Management:    Induction: Intravenous  Airway Management Planned: Natural Airway  Additional Equipment:   Intra-op Plan:   Post-operative Plan:   Informed Consent: I have reviewed the patients History and Physical, chart, labs and discussed the procedure including the risks, benefits and alternatives for the proposed anesthesia with the patient or authorized representative who has indicated his/her understanding and acceptance.     Plan Discussed with: CRNA, Anesthesiologist and Surgeon  Anesthesia Plan Comments:         Anesthesia Quick Evaluation

## 2017-02-10 NOTE — Progress Notes (Signed)
Patient ID: Sharon Gardner, female   DOB: 1991-03-11, 26 y.o.   MRN: [id number] Pt comfortable with epidural  BP 140-150/90's FHR Category 1  Cervix 50/2+/-2  IUPC placed  Will adjust pitocin as needed.

## 2017-02-11 ENCOUNTER — Encounter (HOSPITAL_COMMUNITY): Payer: Self-pay

## 2017-02-11 ENCOUNTER — Encounter (HOSPITAL_COMMUNITY)
Admission: RE | Disposition: A | Discharge status: Home / Self Care | Payer: Self-pay | Source: Ambulatory Visit | Attending: Obstetrics and Gynecology

## 2017-02-11 DIAGNOSIS — Z98891 History of uterine scar from previous surgery: Secondary | ICD-10-CM

## 2017-02-11 LAB — CBC
HCT: 29.5 % — ABNORMAL LOW (ref 36.0–46.0)
Hemoglobin: 10.5 g/dL — ABNORMAL LOW (ref 12.0–15.0)
MCH: 30.2 pg (ref 26.0–34.0)
MCHC: 35.6 g/dL (ref 30.0–36.0)
MCV: 84.8 fL (ref 78.0–100.0)
Platelets: 164 10*3/uL (ref 150–400)
RBC: 3.48 MIL/uL — ABNORMAL LOW (ref 3.87–5.11)
RDW: 13.1 % (ref 11.5–15.5)
WBC: 23.6 10*3/uL — ABNORMAL HIGH (ref 4.0–10.5)

## 2017-02-11 SURGERY — Surgical Case
Anesthesia: Epidural

## 2017-02-11 MED ORDER — MEPERIDINE HCL 25 MG/ML IJ SOLN: 6.2500 mg | INTRAMUSCULAR | Status: DC | PRN

## 2017-02-11 MED ORDER — ONDANSETRON HCL 4 MG/2ML IJ SOLN: 4.0000 mg | Freq: Three times a day (TID) | INTRAMUSCULAR | Status: DC | PRN

## 2017-02-11 MED ORDER — CHLOROPROCAINE HCL (PF) 3 % IJ SOLN
INTRAMUSCULAR | Status: AC
  Filled 2017-02-11: qty 20

## 2017-02-11 MED ORDER — LACTATED RINGERS IV SOLN
INTRAVENOUS | Status: DC
  Administered 2017-02-11 (×2): via INTRAVENOUS

## 2017-02-11 MED ORDER — KETOROLAC TROMETHAMINE 30 MG/ML IJ SOLN: 30.0000 mg | Freq: Four times a day (QID) | INTRAMUSCULAR | Status: AC | PRN

## 2017-02-11 MED ORDER — ONDANSETRON HCL 4 MG/2ML IJ SOLN
4.0000 mg | Freq: Three times a day (TID) | INTRAMUSCULAR | Status: DC | PRN
  Administered 2017-02-11: 4 mg via INTRAVENOUS
  Filled 2017-02-11: qty 2

## 2017-02-11 MED ORDER — NALBUPHINE HCL 10 MG/ML IJ SOLN: 5.0000 mg | INTRAMUSCULAR | Status: DC | PRN

## 2017-02-11 MED ORDER — CHLOROPROCAINE HCL (PF) 3 % IJ SOLN
INTRAMUSCULAR | Status: DC | PRN
  Administered 2017-02-11: 30 mL

## 2017-02-11 MED ORDER — MORPHINE SULFATE (PF) 0.5 MG/ML IJ SOLN
INTRAMUSCULAR | Status: AC
  Filled 2017-02-11: qty 10

## 2017-02-11 MED ORDER — OXYCODONE HCL 5 MG PO TABS
10.0000 mg | ORAL_TABLET | ORAL | Status: DC | PRN
  Filled 2017-02-11: qty 2

## 2017-02-11 MED ORDER — ZOLPIDEM TARTRATE 5 MG PO TABS: 5.0000 mg | ORAL_TABLET | Freq: Every evening | ORAL | Status: DC | PRN

## 2017-02-11 MED ORDER — KETOROLAC TROMETHAMINE 30 MG/ML IJ SOLN
INTRAMUSCULAR | Status: AC
  Filled 2017-02-11: qty 1

## 2017-02-11 MED ORDER — COCONUT OIL OIL: 1.0000 "application " | TOPICAL_OIL | Status: DC | PRN

## 2017-02-11 MED ORDER — DEXAMETHASONE SODIUM PHOSPHATE 4 MG/ML IJ SOLN
INTRAMUSCULAR | Status: AC
  Filled 2017-02-11: qty 1

## 2017-02-11 MED ORDER — SCOPOLAMINE 1 MG/3DAYS TD PT72: 1.0000 | MEDICATED_PATCH | Freq: Once | TRANSDERMAL | Status: DC

## 2017-02-11 MED ORDER — SENNOSIDES-DOCUSATE SODIUM 8.6-50 MG PO TABS
2.0000 | ORAL_TABLET | ORAL | Status: DC
  Administered 2017-02-11 – 2017-02-13 (×2): 2 via ORAL
  Filled 2017-02-11 (×2): qty 2

## 2017-02-11 MED ORDER — LIDOCAINE-EPINEPHRINE (PF) 2 %-1:200000 IJ SOLN
INTRAMUSCULAR | Status: DC | PRN
  Administered 2017-02-11 (×2): 5 mL via INTRADERMAL

## 2017-02-11 MED ORDER — OXYTOCIN 10 UNIT/ML IJ SOLN
INTRAMUSCULAR | Status: AC
  Filled 2017-02-11: qty 4

## 2017-02-11 MED ORDER — NALOXONE HCL 0.4 MG/ML IJ SOLN: 0.4000 mg | INTRAMUSCULAR | Status: DC | PRN

## 2017-02-11 MED ORDER — ACETAMINOPHEN 325 MG PO TABS
650.0000 mg | ORAL_TABLET | ORAL | Status: DC | PRN
  Administered 2017-02-12 – 2017-02-13 (×3): 650 mg via ORAL
  Filled 2017-02-11 (×3): qty 2

## 2017-02-11 MED ORDER — SCOPOLAMINE 1 MG/3DAYS TD PT72
MEDICATED_PATCH | TRANSDERMAL | Status: AC
  Filled 2017-02-11: qty 1

## 2017-02-11 MED ORDER — KETOROLAC TROMETHAMINE 30 MG/ML IJ SOLN: 30.0000 mg | Freq: Four times a day (QID) | INTRAMUSCULAR | Status: DC | PRN

## 2017-02-11 MED ORDER — KETAMINE HCL 10 MG/ML IJ SOLN
INTRAMUSCULAR | Status: DC | PRN
  Administered 2017-02-11 (×5): 10 mg via INTRAVENOUS

## 2017-02-11 MED ORDER — SODIUM CHLORIDE 0.9 % IR SOLN
Status: DC | PRN
  Administered 2017-02-11: 1000 mL

## 2017-02-11 MED ORDER — IBUPROFEN 600 MG PO TABS
600.0000 mg | ORAL_TABLET | Freq: Four times a day (QID) | ORAL | Status: DC
  Administered 2017-02-11 – 2017-02-13 (×5): 600 mg via ORAL
  Filled 2017-02-11 (×6): qty 1

## 2017-02-11 MED ORDER — DIBUCAINE 1 % RE OINT: 1.0000 "application " | TOPICAL_OINTMENT | RECTAL | Status: DC | PRN

## 2017-02-11 MED ORDER — NALBUPHINE HCL 10 MG/ML IJ SOLN: 5.0000 mg | Freq: Once | INTRAMUSCULAR | Status: DC | PRN

## 2017-02-11 MED ORDER — HYDROMORPHONE HCL 1 MG/ML IJ SOLN
INTRAMUSCULAR | Status: AC
  Filled 2017-02-11: qty 1

## 2017-02-11 MED ORDER — DIPHENHYDRAMINE HCL 25 MG PO CAPS: 25.0000 mg | ORAL_CAPSULE | Freq: Four times a day (QID) | ORAL | Status: DC | PRN

## 2017-02-11 MED ORDER — DIPHENHYDRAMINE HCL 50 MG/ML IJ SOLN: 12.5000 mg | INTRAMUSCULAR | Status: DC | PRN

## 2017-02-11 MED ORDER — FENTANYL CITRATE (PF) 100 MCG/2ML IJ SOLN
INTRAMUSCULAR | Status: DC | PRN
  Administered 2017-02-11 (×2): 50 ug via INTRAVENOUS

## 2017-02-11 MED ORDER — OXYCODONE HCL 5 MG PO TABS
5.0000 mg | ORAL_TABLET | ORAL | Status: DC | PRN
  Administered 2017-02-12 – 2017-02-13 (×3): 5 mg via ORAL
  Filled 2017-02-11 (×2): qty 1

## 2017-02-11 MED ORDER — PROMETHAZINE HCL 25 MG/ML IJ SOLN: 6.2500 mg | INTRAMUSCULAR | Status: DC | PRN

## 2017-02-11 MED ORDER — MORPHINE SULFATE (PF) 0.5 MG/ML IJ SOLN
INTRAMUSCULAR | Status: DC | PRN
  Administered 2017-02-11: .5 mg via EPIDURAL
  Administered 2017-02-11: 4 mg via EPIDURAL
  Administered 2017-02-11: .5 mg via EPIDURAL

## 2017-02-11 MED ORDER — DIPHENHYDRAMINE HCL 25 MG PO CAPS: 25.0000 mg | ORAL_CAPSULE | ORAL | Status: DC | PRN

## 2017-02-11 MED ORDER — GENTAMICIN SULFATE 40 MG/ML IJ SOLN
INTRAVENOUS | Status: AC
  Administered 2017-02-11: 115.75 mL via INTRAVENOUS
  Filled 2017-02-11: qty 9.75

## 2017-02-11 MED ORDER — WITCH HAZEL-GLYCERIN EX PADS
1.0000 | MEDICATED_PAD | CUTANEOUS | Status: DC | PRN
Start: 2017-02-11 — End: 2017-02-13

## 2017-02-11 MED ORDER — PHENYLEPHRINE 40 MCG/ML (10ML) SYRINGE FOR IV PUSH (FOR BLOOD PRESSURE SUPPORT)
PREFILLED_SYRINGE | INTRAVENOUS | Status: AC
  Filled 2017-02-11: qty 10

## 2017-02-11 MED ORDER — FENTANYL CITRATE (PF) 100 MCG/2ML IJ SOLN
INTRAMUSCULAR | Status: AC
  Filled 2017-02-11: qty 2

## 2017-02-11 MED ORDER — OXYTOCIN 40 UNITS IN LACTATED RINGERS INFUSION - SIMPLE MED
2.5000 [IU]/h | INTRAVENOUS | Status: AC
Start: 2017-02-11 — End: 2017-02-12

## 2017-02-11 MED ORDER — DIPHENHYDRAMINE HCL 25 MG PO CAPS
25.0000 mg | ORAL_CAPSULE | ORAL | Status: DC | PRN
  Filled 2017-02-11: qty 1

## 2017-02-11 MED ORDER — DEXTROSE 5 % IV SOLN
1.0000 ug/kg/h | INTRAVENOUS | Status: DC | PRN
  Filled 2017-02-11: qty 2

## 2017-02-11 MED ORDER — PROPOFOL 10 MG/ML IV BOLUS
INTRAVENOUS | Status: DC | PRN
  Administered 2017-02-11: 20 mg via INTRAVENOUS
  Administered 2017-02-11 (×3): 10 mg via INTRAVENOUS

## 2017-02-11 MED ORDER — SCOPOLAMINE 1 MG/3DAYS TD PT72
MEDICATED_PATCH | TRANSDERMAL | Status: DC | PRN
  Administered 2017-02-11: 1 via TRANSDERMAL

## 2017-02-11 MED ORDER — HYDROMORPHONE HCL 1 MG/ML IJ SOLN
0.2500 mg | INTRAMUSCULAR | Status: DC | PRN
  Administered 2017-02-11: 0.5 mg via INTRAVENOUS

## 2017-02-11 MED ORDER — TETANUS-DIPHTH-ACELL PERTUSSIS 5-2.5-18.5 LF-MCG/0.5 IM SUSP: 0.5000 mL | Freq: Once | INTRAMUSCULAR | Status: DC

## 2017-02-11 MED ORDER — MENTHOL 3 MG MT LOZG: 1.0000 | LOZENGE | OROMUCOSAL | Status: DC | PRN

## 2017-02-11 MED ORDER — MEPERIDINE HCL 25 MG/ML IJ SOLN
INTRAMUSCULAR | Status: AC
  Filled 2017-02-11: qty 1

## 2017-02-11 MED ORDER — ONDANSETRON HCL 4 MG/2ML IJ SOLN
INTRAMUSCULAR | Status: AC
  Filled 2017-02-11: qty 2

## 2017-02-11 MED ORDER — MEPERIDINE HCL 25 MG/ML IJ SOLN
INTRAMUSCULAR | Status: DC | PRN
  Administered 2017-02-11 (×2): 12.5 mg via INTRAVENOUS

## 2017-02-11 MED ORDER — KETOROLAC TROMETHAMINE 30 MG/ML IJ SOLN
30.0000 mg | Freq: Four times a day (QID) | INTRAMUSCULAR | Status: DC | PRN
  Administered 2017-02-11: 30 mg via INTRAMUSCULAR

## 2017-02-11 MED ORDER — SIMETHICONE 80 MG PO CHEW
80.0000 mg | CHEWABLE_TABLET | Freq: Three times a day (TID) | ORAL | Status: DC
  Administered 2017-02-11 – 2017-02-12 (×4): 80 mg via ORAL
  Filled 2017-02-11 (×4): qty 1

## 2017-02-11 MED ORDER — KETAMINE HCL 10 MG/ML IJ SOLN
INTRAMUSCULAR | Status: AC
  Filled 2017-02-11: qty 1

## 2017-02-11 MED ORDER — SODIUM CHLORIDE 0.9% FLUSH: 3.0000 mL | INTRAVENOUS | Status: DC | PRN

## 2017-02-11 MED ORDER — NALOXONE HCL 0.4 MG/ML IJ SOLN
0.4000 mg | INTRAMUSCULAR | Status: DC | PRN
Start: 2017-02-11 — End: 2017-02-13

## 2017-02-11 MED ORDER — PRENATAL MULTIVITAMIN CH
1.0000 | ORAL_TABLET | Freq: Every day | ORAL | Status: DC
  Administered 2017-02-12: 1 via ORAL
  Filled 2017-02-11: qty 1

## 2017-02-11 MED ORDER — IBUPROFEN 600 MG PO TABS
600.0000 mg | ORAL_TABLET | Freq: Four times a day (QID) | ORAL | Status: DC | PRN
  Administered 2017-02-12: 600 mg via ORAL

## 2017-02-11 MED ORDER — ACETAMINOPHEN 500 MG PO TABS
1000.0000 mg | ORAL_TABLET | Freq: Four times a day (QID) | ORAL | Status: AC
  Administered 2017-02-11 – 2017-02-12 (×2): 1000 mg via ORAL
  Filled 2017-02-11 (×2): qty 2

## 2017-02-11 MED ORDER — SIMETHICONE 80 MG PO CHEW: 80.0000 mg | CHEWABLE_TABLET | ORAL | Status: DC | PRN

## 2017-02-11 MED ORDER — NALOXONE HCL 2 MG/2ML IJ SOSY
1.0000 ug/kg/h | PREFILLED_SYRINGE | INTRAVENOUS | Status: DC | PRN
  Filled 2017-02-11: qty 2

## 2017-02-11 MED ORDER — SIMETHICONE 80 MG PO CHEW
80.0000 mg | CHEWABLE_TABLET | ORAL | Status: DC
  Administered 2017-02-11 – 2017-02-13 (×2): 80 mg via ORAL
  Filled 2017-02-11 (×2): qty 1

## 2017-02-11 SURGICAL SUPPLY — 41 items
BENZOIN TINCTURE PRP APPL 2/3 (GAUZE/BANDAGES/DRESSINGS) ×3 IMPLANT
CHLORAPREP W/TINT 26ML (MISCELLANEOUS) ×3 IMPLANT
CLAMP CORD UMBIL (MISCELLANEOUS) IMPLANT
CLOSURE STERI-STRIP 1/2X4 (GAUZE/BANDAGES/DRESSINGS) ×1
CLOSURE WOUND 1/2 X4 (GAUZE/BANDAGES/DRESSINGS)
CLOTH BEACON ORANGE TIMEOUT ST (SAFETY) ×3 IMPLANT
CLSR STERI-STRIP ANTIMIC 1/2X4 (GAUZE/BANDAGES/DRESSINGS) ×2 IMPLANT
DRSG OPSITE POSTOP 4X10 (GAUZE/BANDAGES/DRESSINGS) ×3 IMPLANT
ELECT REM PT RETURN 9FT ADLT (ELECTROSURGICAL) ×3
ELECTRODE REM PT RTRN 9FT ADLT (ELECTROSURGICAL) ×1 IMPLANT
EXTRACTOR VACUUM KIWI (MISCELLANEOUS) IMPLANT
GLOVE BIO SURGEON STRL SZ 6.5 (GLOVE) ×2 IMPLANT
GLOVE BIO SURGEONS STRL SZ 6.5 (GLOVE) ×1
GLOVE BIOGEL PI IND STRL 7.0 (GLOVE) ×1 IMPLANT
GLOVE BIOGEL PI INDICATOR 7.0 (GLOVE) ×2
GOWN STRL REUS W/TWL LRG LVL3 (GOWN DISPOSABLE) ×6 IMPLANT
KIT ABG SYR 3ML LUER SLIP (SYRINGE) IMPLANT
NEEDLE HYPO 25X5/8 SAFETYGLIDE (NEEDLE) IMPLANT
NS IRRIG 1000ML POUR BTL (IV SOLUTION) ×3 IMPLANT
PACK C SECTION WH (CUSTOM PROCEDURE TRAY) ×3 IMPLANT
PAD OB MATERNITY 4.3X12.25 (PERSONAL CARE ITEMS) ×3 IMPLANT
PENCIL SMOKE EVAC W/HOLSTER (ELECTROSURGICAL) ×3 IMPLANT
RTRCTR C-SECT PINK 25CM LRG (MISCELLANEOUS) ×3 IMPLANT
STRIP CLOSURE SKIN 1/2X4 (GAUZE/BANDAGES/DRESSINGS) IMPLANT
SUT CHROMIC 1 CTX 36 (SUTURE) ×6 IMPLANT
SUT PLAIN 0 NONE (SUTURE) IMPLANT
SUT PLAIN 2 0 (SUTURE) ×2
SUT PLAIN 2 0 XLH (SUTURE) ×6 IMPLANT
SUT PLAIN ABS 2-0 54XMFL TIE (SUTURE) ×1 IMPLANT
SUT VIC AB 0 CT1 27 (SUTURE) ×4
SUT VIC AB 0 CT1 27XBRD ANBCTR (SUTURE) ×2 IMPLANT
SUT VIC AB 2-0 CT1 (SUTURE) ×3 IMPLANT
SUT VIC AB 2-0 CT1 27 (SUTURE) ×2
SUT VIC AB 2-0 CT1 TAPERPNT 27 (SUTURE) ×1 IMPLANT
SUT VIC AB 3-0 CT1 27 (SUTURE)
SUT VIC AB 3-0 CT1 TAPERPNT 27 (SUTURE) IMPLANT
SUT VIC AB 3-0 SH 27 (SUTURE) ×2
SUT VIC AB 3-0 SH 27X BRD (SUTURE) ×1 IMPLANT
SUT VIC AB 4-0 KS 27 (SUTURE) ×3 IMPLANT
TOWEL OR 17X24 6PK STRL BLUE (TOWEL DISPOSABLE) ×3 IMPLANT
TRAY FOLEY BAG SILVER LF 14FR (SET/KITS/TRAYS/PACK) ×3 IMPLANT

## 2017-02-11 NOTE — Anesthesia Postprocedure Evaluation (Signed)
Anesthesia Post Note  Patient: Sharon Gardner  Procedure(s) Performed: Procedure(s) (LRB): CESAREAN SECTION (N/A)  Patient location during evaluation: PACU Anesthesia Type: Epidural Level of consciousness: awake Pain management: pain level controlled Vital Signs Assessment: post-procedure vital signs reviewed and stable Respiratory status: spontaneous breathing Cardiovascular status: stable Postop Assessment: no headache, no backache, epidural receding, patient able to bend at knees and no signs of nausea or vomiting Anesthetic complications: no        Last Vitals:  Vitals:   02/11/17 0743 02/11/17 0744  BP:    Pulse: 92 90  Resp: 20 (!) 21  Temp:      Last Pain:  Vitals:   02/11/17 0422  TempSrc: Axillary  PainSc: 0-No pain   Pain Goal:                 Caidyn Blossom JR,JOHN Letishia Elliott

## 2017-02-11 NOTE — Progress Notes (Signed)
Dr Marvel Plan at bedside discussing risks & benefits of proceeding with delivery via c/s at this time.

## 2017-02-11 NOTE — Lactation Note (Signed)
This note was copied from a baby's chart. Lactation Consultation Note  Baby 35 hours old.  Room full of visitors. Mother eating and baby sleeping. Mother states baby has not latched yet.   At 1208 today, Devin RN attempted latching baby too sleepy and spoon fed baby approx 1 ml. Encouraged placing baby STS until she breastfeeds.  Baby will receive bath shortly. Discussed continuing to spoon feed baby and call for assistance with next feeding. Mom encouraged to feed baby 8-12 times/24 hours and with feeding cues.  Mom made aware of O/P services, breastfeeding support groups, community resources, and our phone # for post-discharge questions.    Patient Name: Sharon Gardner [id number] Date: 02/11/2017 Reason for consult: Initial assessment   Maternal Data Has patient been taught Hand Expression?: Yes Does the patient have breastfeeding experience prior to this delivery?: No  Feeding Feeding Type: Breast Fed Length of feed: 0 min  LATCH Score/Interventions                      Lactation Tools Discussed/Used     Consult Status Consult Status: Follow-up Date: 02/12/17 Follow-up type: In-patient    Vivianne Master [facility] 02/11/2017, 2:24 PM

## 2017-02-11 NOTE — Progress Notes (Signed)
Patient ID: Sharon Gardner, female   DOB: 07-May-1991, 26 y.o.   MRN: [id number] Pt comfortable with epidural, sleeping off and on  BP  140/80-150/90  FHR category 1  Cervix with little change in over 7 hours  4+/80/-1  IUPC had gotten pulled out inadvertently when pt moving in bed, so replaced Pitocin at 12 mu.   D/w pt protracted latent phase still possible and she is feeling ready to proceed with c-section.  I advised her to let me assess contractions and try exaggerated Sims to see if we can get any cervical change.  We will reassess in 2 hours.

## 2017-02-11 NOTE — Anesthesia Postprocedure Evaluation (Addendum)
Anesthesia Post Note  Patient: Sharon Gardner  Procedure(s) Performed: Procedure(s) (LRB): CESAREAN SECTION (N/A)  Patient location during evaluation: Mother Baby Anesthesia Type: Epidural Level of consciousness: awake and alert and oriented Pain management: pain level controlled Vital Signs Assessment: post-procedure vital signs reviewed and stable Respiratory status: spontaneous breathing and nonlabored ventilation Cardiovascular status: stable Postop Assessment: no headache, no backache, patient able to bend at knees, epidural receding, no signs of nausea or vomiting and adequate PO intake Anesthetic complications: no        Last Vitals:  Vitals:   02/11/17 1023 02/11/17 1152  BP: (!) 141/88 134/82  Pulse: 65 77  Resp: 18 20  Temp: 36.3 C 36.7 C    Last Pain:  Vitals:   02/11/17 1152  TempSrc: Oral  PainSc: 0-No pain   Pain Goal:                 [employer]

## 2017-02-11 NOTE — Progress Notes (Signed)
Patient ID: Sharon Gardner, female   DOB: 02/27/1991, 26 y.o.   MRN: [id number] Pt comfortable with epidural.  afeb FHR overall category 1, mild tachycardia  Cervix unchanged despite adequate contractions 4+/80/-1  Pt requests proceeding with c-section.  Offered continued labor, but feel it is reasonable to proceed at this point.  Risks and benefits d/w pt including bleeding, infection and possible damage to bowel and bladder.  Pt desires to proceed.  Will d/c pitocin and proceed when OR ready

## 2017-02-11 NOTE — Op Note (Signed)
Operative Note    Preoperative Diagnosis Term pregnancy at 37 4/7 weeks Arrest of dilation Gestational Hypertension  Postoperative Diagnosis same  Procedure Primary low transverse c-section and 2 layer closure of uterus  Surgeon Paula Compton, MD  Anesthesia Epidural, local, IV sedation  Fluids: EBL 671mL UOP 438mL IVF 2079mL  Findings A viable female infant, OP Apgars 8,9  Weight pending.  Normal uterus, ovaries and tubes  Specimen Placenta to L&D   Procedure Note Patient was taken to the operating room where the epidural anesthesia was boosted but had to be pulled back and re-dosed to achieve a level.   She was prepped and draped in the normal sterile fashion in the dorsal supine position with a leftward tilt. An appropriate time out was performed.  Patient was comfortable with the allis clamp test.   A Pfannenstiel skin incision was then made with the scalpel and carried through to the underlying layer of fascia by sharp dissection and Bovie cautery. The fascia was nicked in the midline and the incision was extended laterally with Mayo scissors. The inferior aspect of the incision was grasped Coker clamps and dissected off the underlying rectus muscles. In a similar fashion the superior aspect was dissected off the rectus muscles. Rectus muscles were separated in the midline and the peritoneal cavity entered bluntly. The peritoneal incision was then extended both superiorly and inferiorly with careful attention to avoid both bowel and bladder. The Alexis self-retaining wound retractor was then placed within the incision and the lower uterine segment exposed. The bladder flap was developed with Metzenbaum scissors and pushed away from the lower uterine segment. The lower uterine segment was then incised in a transverse fashion and the cavity itself entered bluntly. The incision was extended bluntly. The infant's head was then lifted and delivered from the incision without  difficulty. The remainder of the infant delivered and the nose and mouth bulb suctioned with the cord clamped and cut as well. The infant was handed off to the waiting pediatricians. The placenta was then spontaneously expressed from the uterus and the uterus cleared of all clots and debris with moist lap sponge. The uterine incision was then repaired in 2 layers the first layer was a running locked layer 1-0 chromic and the second an imbricating layer of the same suture. The tubes and ovaries were inspected and the gutters cleared of all clots and debris. The uterine incision was inspected and found to be hemostatic. At this point brisk bleeding was noted under the right fascial edge from a small arteriole in the rectus muscle.  Several figure-of-eight sutures of 2-0 vicryl were needed to gain hemostasis.  Once hemostasis was noted, all instruments and sponges as well as the Alexis retractor were then removed from the abdomen. The rectus muscles and peritoneum were then reapproximated with a running suture of 2-0 Vicryl. The fascia was then closed with 0 Vicryl in a running fashion. Subcutaneous tissue was reapproximated with 3-0 plain in a running fashion. The skin was closed with a subcuticular stitch of 4-0 Vicryl on a Keith needle and then reinforced with benzoin and Steri-Strips. At the conclusion of the procedure all instruments and sponge counts were correct. Patient was taken to the recovery room in good condition with her baby accompanying her skin to skin.

## 2017-02-11 NOTE — Transfer of Care (Signed)
Immediate Anesthesia Transfer of Care Note  Patient: Sharon Gardner  Procedure(s) Performed: Procedure(s): CESAREAN SECTION (N/A)  Patient Location: PACU  Anesthesia Type:Epidural  Level of Consciousness: awake  Airway & Oxygen Therapy: Patient Spontanous Breathing  Post-op Assessment: Report given to RN and Post -op Vital signs reviewed and stable  Post vital signs: Reviewed and stable  Last Vitals:  Vitals:   02/11/17 0702 02/11/17 0703  BP:    Pulse: 97 98  Resp: 17 12  Temp:      Last Pain:  Vitals:   02/11/17 0422  TempSrc: Axillary  PainSc: 0-No pain         Complications: No apparent anesthesia complications

## 2017-02-11 NOTE — Transfer of Care (Signed)
Immediate Anesthesia Transfer of Care Note  Patient: Sharon Gardner  Procedure(s) Performed: Procedure(s): CESAREAN SECTION (N/A)  Patient Location: PACU  Anesthesia Type:Epidural  Level of Consciousness: awake  Airway & Oxygen Therapy: Patient Spontanous Breathing  Post-op Assessment: Report given to RN  Post vital signs: Reviewed and stable  Last Vitals:  Vitals:   02/11/17 0702 02/11/17 0703  BP:    Pulse: 97 98  Resp: 17 12  Temp:      Last Pain:  Vitals:   02/11/17 0422  TempSrc: Axillary  PainSc: 0-No pain         Complications: No apparent anesthesia complications

## 2017-02-11 NOTE — Addendum Note (Signed)
Addendum  created 02/11/17 1347 by Hewitt Blade, CRNA   Sign clinical note

## 2017-02-11 NOTE — Addendum Note (Signed)
Addendum  created 02/11/17 0931 by Lyn Hollingshead, MD   Order list changed, Order sets accessed

## 2017-02-12 LAB — CBC
HCT: 28.8 % — ABNORMAL LOW (ref 36.0–46.0)
Hemoglobin: 10.1 g/dL — ABNORMAL LOW (ref 12.0–15.0)
MCH: 30.1 pg (ref 26.0–34.0)
MCHC: 35.1 g/dL (ref 30.0–36.0)
MCV: 86 fL (ref 78.0–100.0)
Platelets: 148 10*3/uL — ABNORMAL LOW (ref 150–400)
RBC: 3.35 MIL/uL — ABNORMAL LOW (ref 3.87–5.11)
RDW: 13.4 % (ref 11.5–15.5)
WBC: 15.6 10*3/uL — ABNORMAL HIGH (ref 4.0–10.5)

## 2017-02-12 LAB — BIRTH TISSUE RECOVERY COLLECTION (PLACENTA DONATION)

## 2017-02-12 NOTE — Lactation Note (Signed)
This note was copied from a baby's chart. Lactation Consultation Note  Baby 60 hours old and mother states baby recently bf for a few minutes and fell asleep. Reviewed waking techniques.  Undressed baby. Reviewed hand expression with mother and she was able to express drops on spoon. Gave drops to baby to wake.  Attempted latching without NS but baby did not suck. Baby has short anterior lingual frenulum contributing to difficulty protruding her tongue. Encouraged family to discuss with Pediatrician and gave family resource information sheets. Assisted w/ applying #20NS and baby latched in football hold and sustained latch for more than 10 min. Encouraged mother to compress breast during feeding. Suggest mother post pump with DEBP and mother states she wanted to wait to go home before she start pumping. Provided education and recommend mother hand express and spoon feed or pump w/ manual pump after each feeding if she is using nipple shield. Encouraged mother to call if she needs assistance w/ latching. Mom encouraged to feed baby 8-12 times/24 hours and with feeding cues.     Patient Name: Sharon Gardner [id number] Date: 02/12/2017 Reason for consult: Follow-up assessment   Maternal Data    Feeding Feeding Type: Breast Fed Length of feed: 15 min  LATCH Score/Interventions Latch: Repeated attempts needed to sustain latch, nipple held in mouth throughout feeding, stimulation needed to elicit sucking reflex. Intervention(s): Waking techniques;Teach feeding cues;Skin to skin Intervention(s): Adjust position;Breast massage;Assist with latch;Breast compression  Audible Swallowing: A few with stimulation Intervention(s): Hand expression;Skin to skin Intervention(s): Alternate breast massage  Type of Nipple: Everted at rest and after stimulation  Comfort (Breast/Nipple): Soft / non-tender     Hold (Positioning): Assistance needed to correctly position infant at breast and  maintain latch.  LATCH Score: 7  Lactation Tools Discussed/Used Tools: Nipple Shields Nipple shield size: 20 Breast pump type: Manual   Consult Status Consult Status: Follow-up Date: 02/13/17 Follow-up type: In-patient    Vivianne Master [facility] 02/12/2017, 2:17 PM

## 2017-02-12 NOTE — Progress Notes (Signed)
Subjective: Postpartum Day #1: Cesarean Delivery Patient reports incisional pain, tolerating PO and no problems voiding.    Objective: Vital signs in last 24 hours: Temp:  [97.3 F (36.3 C)-98.7 F (37.1 C)] 97.9 F (36.6 C) (05/26 0325) Pulse Rate:  [63-81] 63 (05/26 0325) Resp:  [16-20] 18 (05/26 0325) BP: (122-144)/(66-88) 122/66 (05/26 0325) SpO2:  [92 %-97 %] 97 % (05/26 0325)  Physical Exam:  General: alert Lochia: appropriate Uterine Fundus: firm Incision: dressing 2/3 saturated with bloody drainage   Recent Labs  02/11/17 0830 02/12/17 0510  HGB 10.5* 10.1*  HCT 29.5* 28.8*    Assessment/Plan: Status post Cesarean section. Doing well postoperatively.  Continue current care, ambulate, will change dressing.  Blane Ohara Barbaraann Avans 02/12/2017, 9:17 AM

## 2017-02-12 NOTE — Lactation Note (Signed)
This note was copied from a baby's chart. Lactation Consultation Note Baby hadn't BF well per RN since birth. LC assisted in latching to breast. Baby unable to maintain latch.  Mom has short shaft everted nipples. LC can firmly compress, but not easy for baby. Hand expression colostrum to soften areola for easier compression. Helpful, cont. Unable to maintain latch.  Noted baby has heart shaped tongue. Noted frenulum once when cried. Has weak suck w/gloved finger. Uncoordinated suck w/occasional tongue thrusting.  Mom stated baby had been spitty some. Fitted mom w/#20 NS. Assisted in latch football position. Taught "C" hold for latching. Mom denied painful latch. Stated she felt baby sucking, denied pinching or pain. Mom needed to be reminded to firm breast w/"C" hold at intervals if baby came off or baby loosing firm hold on NS. Discussed baby needing the Sharon back into mouth firmly, not sucking NS in and out d/t breast soft and not having deep latch.  Heard baby swallowing frequently. Baby stopped to rest. Unlatched to assess NS for colostrum. None pooled in NS, just skin wet at Sharon and areola.? LC felt that as many swallows baby was doing, it had to be more than saliva.  Hand expressed 8 ml colostrum easily from Rt breast while baby BF on Lt. Spoon fed baby after BF. Baby tongue thrust frequently w/spoon feeding. Heart shaped tongue clearly noted.  Discussed positioning, cluster feeding, I&O.  After feeding mom's Lt. Sharon appeared to have a little white tissue at end tips of Sharon. Mom denied pain. Again stressed importance of baby keeping good depth of NS, not popping in and out. Reported to RN of status. Patient Name: Sharon Gardner [id number] Date: 02/12/2017 Reason for consult: Follow-up assessment;Difficult latch   Maternal Data    Feeding Feeding Type: Breast Fed Length of feed: 25 min  LATCH Score/Interventions Latch: Repeated attempts needed to sustain latch, Sharon held  in mouth throughout feeding, stimulation needed to elicit sucking reflex. Intervention(s): Skin to skin;Teach feeding cues;Waking techniques Intervention(s): Adjust position;Assist with latch;Breast massage;Breast compression  Audible Swallowing: A few with stimulation Intervention(s): Hand expression Intervention(s): Alternate breast massage  Type of Sharon: Everted at rest and after stimulation  Comfort (Breast/Sharon): Soft / non-tender     Hold (Positioning): Full assist, staff holds infant at breast Intervention(s): Breastfeeding basics reviewed;Support Pillows;Position options;Skin to skin  LATCH Score: 6  Lactation Tools Discussed/Used Tools: Shells;Sharon Gardner;Pump Sharon shield size: 20 Shell Type: Inverted Breast pump type: Manual Pump Review: Setup, frequency, and cleaning;Milk Storage Initiated by:: Allayne Stack RN IBCLC Date initiated:: 02/12/17   Consult Status Consult Status: Follow-up Date: 02/12/17 Follow-up type: In-patient    Sharon Gardner, [name] 02/12/2017, 2:10 AM

## 2017-02-13 ENCOUNTER — Encounter (HOSPITAL_COMMUNITY): Payer: Self-pay

## 2017-02-13 MED ORDER — OXYCODONE HCL 5 MG PO TABS: 5.0000 mg | ORAL_TABLET | ORAL | 0 refills | Status: DC | PRN

## 2017-02-13 MED ORDER — IBUPROFEN 600 MG PO TABS: 600.0000 mg | ORAL_TABLET | Freq: Four times a day (QID) | ORAL | 0 refills | Status: DC | PRN

## 2017-02-13 NOTE — Lactation Note (Signed)
This note was copied from a baby's chart. Lactation Consultation Note  Baby 46 hours old.  [redacted]w[redacted]d.  7.9% weight loss,  Short anterior lingual frenulum. Mother pumped approx 24 ml earlier and gave volume back to baby. Suggest continuing to supplement baby with breastmilk until weight stabilizes. Mother using #20NS to latch. Attempted without nipple shield and baby comes off and on breast. Applied nipple shield and baby sustained latch on R side for approx 12 min. FOB burped baby and then mother latched baby on L side.  Baby latched for an additional 10 min. Suggest mother post pump 4-6 times per day for 10-15 min. Mother has personal DEBP at home. Wake baby for feedings q3 hours if sleepy.  Be sure to breastfeed on both breasts. Answered questions regarding feeding and regarding frenulum.  Referred parents to Pediatrician. Mom encouraged to feed baby 8-12 times/24 hours and with feeding cues.  Reviewed engorgement care and monitoring voids/stools.    Patient Name: Sharon Gardner [id number] Date: 02/13/2017 Reason for consult: Follow-up assessment   Maternal Data    Feeding Feeding Type: Breast Fed  LATCH Score/Interventions Latch: Repeated attempts needed to sustain latch, nipple held in mouth throughout feeding, stimulation needed to elicit sucking reflex. Intervention(s): Skin to skin;Teach feeding cues;Waking techniques  Audible Swallowing: A few with stimulation Intervention(s): Skin to skin  Type of Nipple: Everted at rest and after stimulation  Comfort (Breast/Nipple): Filling, red/small blisters or bruises, mild/mod discomfort  Problem noted: Mild/Moderate discomfort  Hold (Positioning): Assistance needed to correctly position infant at breast and maintain latch.  LATCH Score: 6  Lactation Tools Discussed/Used Tools: Nipple Shields Nipple shield size: 20 Breast pump type: Double-Electric Breast Pump   Consult Status Consult Status:  Complete    Carlye Grippe 02/13/2017, 9:51 AM

## 2017-02-13 NOTE — Discharge Summary (Signed)
OB Discharge Summary     Patient Name: Sharon Gardner DOB: 11/23/1990 MRN: [id number]  Date of admission: 02/10/2017 Delivering MD: Paula Compton   Date of discharge: 02/13/2017  Admitting diagnosis: INDUCTION Intrauterine pregnancy: [redacted]w[redacted]d     Secondary diagnosis:  Active Problems:   Gestational hypertension, third trimester   S/P primary low transverse C-section      Discharge diagnosis: Term Pregnancy Delivered, Gestational Hypertension and arrest of dilation                                   Hospital course:  Induction of Labor With Cesarean Section  26 y.o. yo G2P1011 at [redacted]w[redacted]d was admitted to the hospital 02/10/2017 for induction of labor. Patient had a labor course significant for prolonged active phase. The patient went for cesarean section due to Arrest of Dilation, and delivered a Viable infant,@BABYSUPPRESS (DBLINK,ept,110,,1,,) Membrane Rupture Time/Date: )8:50 AM ,02/10/2017   @Details  of operation can be found in separate operative Note.  Patient had an uncomplicated postpartum course, BP remained normal. She is ambulating, tolerating a regular diet, passing flatus, and urinating well.  Patient is discharged home in stable condition on 02/13/17.                                    Physical exam  Vitals:   02/12/17 0325 02/12/17 1806 02/13/17 0001 02/13/17 0900  BP: 122/66 (!) 146/79 136/72 140/86  Pulse: 63 68 87 97  Resp: 18 12 16 18   Temp: 97.9 F (36.6 C) 98.4 F (36.9 C) 98.1 F (36.7 C) 98.1 F (36.7 C)  TempSrc: Oral Oral Oral Oral  SpO2: 97% 97% 98%   Weight:      Height:       General: alert Lochia: appropriate Uterine Fundus: firm Incision: Healing well with no significant drainage  Labs: Lab Results  Component Value Date   WBC 15.6 (H) 02/12/2017   HGB 10.1 (L) 02/12/2017   HCT 28.8 (L) 02/12/2017   MCV 86.0 02/12/2017   PLT 148 (L) 02/12/2017   CMP Latest Ref Rng & Units 02/10/2017  Glucose 65 - 99 mg/dL 80  BUN 6 - 20 mg/dL 8   Creatinine 0.44 - 1.00 mg/dL 0.56  Sodium 135 - 145 mmol/L 135  Potassium 3.5 - 5.1 mmol/L 3.2(L)  Chloride 101 - 111 mmol/L 105  CO2 22 - 32 mmol/L 21(L)  Calcium 8.9 - 10.3 mg/dL 9.0  Total Protein 6.5 - 8.1 g/dL 6.4(L)  Total Bilirubin 0.3 - 1.2 mg/dL 0.2(L)  Alkaline Phos 38 - 126 U/L 108  AST 15 - 41 U/L 19  ALT 14 - 54 U/L 9(L)    Discharge instruction: per After Visit Summary and "Baby and Me Booklet".  After visit meds:  Allergies as of 02/13/2017      Reactions   Amoxicillin Rash   Has patient had a PCN reaction causing immediate rash, facial/tongue/throat swelling, SOB or lightheadedness with hypotension: No Has patient had a PCN reaction causing severe rash involving mucus membranes or skin necrosis: No Has patient had a PCN reaction that required hospitalization No Has patient had a PCN reaction occurring within the last 10 years: Yes If all of the above answers are "NO", then may proceed with Cephalosporin use.      Medication List    TAKE these medications  acetaminophen 325 MG tablet Commonly known as:  TYLENOL Take 650 mg by mouth every 6 (six) hours as needed for mild pain or headache.   ibuprofen 600 MG tablet Commonly known as:  ADVIL,MOTRIN Take 1 tablet (600 mg total) by mouth every 6 (six) hours as needed for mild pain.   oxyCODONE 5 MG immediate release tablet Commonly known as:  Oxy IR/ROXICODONE Take 1 tablet (5 mg total) by mouth every 4 (four) hours as needed for severe pain.   prenatal multivitamin Tabs tablet Take 1 tablet by mouth daily at 12 noon.       Diet: routine diet  Activity: Advance as tolerated. Pelvic rest for 6 weeks.   Outpatient follow up:2 weeks   Newborn Data: Live born female  Birth Weight: 7 lb 1.9 oz (3230 g) APGAR: 10, 10  Baby Feeding: Breast Disposition:home with mother   02/13/2017 Clarene Duke, MD

## 2017-02-13 NOTE — Progress Notes (Signed)
Patient ID: Sharon Gardner, female   DOB: [birth year], 26 y.o.   MRN: [id number] POD #2 Doing well, wants to go home Afeb, VSS Abd- soft, fundus firm, incision intact D/c home

## 2017-02-13 NOTE — Discharge Instructions (Signed)
As per discharge pamphlet °

## 2017-02-28 ENCOUNTER — Inpatient Hospital Stay (HOSPITAL_COMMUNITY)
Admission: AD | Admit: 2017-02-28 | Payer: BLUE CROSS/BLUE SHIELD | Source: Ambulatory Visit | Admitting: Obstetrics and Gynecology

## 2017-04-01 NOTE — Addendum Note (Signed)
Addendum  created 04/01/17 1108 by Nolon Nations, MD   Sign clinical note

## 2017-08-16 ENCOUNTER — Encounter: Payer: Self-pay | Admitting: Family Medicine

## 2017-08-16 ENCOUNTER — Ambulatory Visit: Payer: 59 | Admitting: Family Medicine

## 2017-08-16 VITALS — BP 120/78 | HR 105 | Ht 64.5 in | Wt 136.2 lb

## 2017-08-16 DIAGNOSIS — R519 Headache, unspecified: Secondary | ICD-10-CM | POA: Insufficient documentation

## 2017-08-16 DIAGNOSIS — Z23 Encounter for immunization: Secondary | ICD-10-CM

## 2017-08-16 DIAGNOSIS — G44209 Tension-type headache, unspecified, not intractable: Secondary | ICD-10-CM

## 2017-08-16 DIAGNOSIS — R51 Headache: Secondary | ICD-10-CM

## 2017-08-16 NOTE — Assessment & Plan Note (Addendum)
Seems to be most consistent with tension type headaches.  No red flag signs or symptoms on exam.  Her neurological exam is normal.  She has a few factors working against her-she is taking an analgesic nearly every day.  She is also using caffeine nearly daily.  Discussed lifestyle interventions with patient including avoidance of analgesics and use of no more than once or twice per week.  Also discussed role of good sleep and good oral hydration.  Discussed need to take breaks every 30 minutes to an hour to rest eyes while at work.  Patient for medication- given that she is breast-feeding, there is no good medication for this.  Advised her to use ibuprofen or Tylenol as needed sparingly as noted above.  Discussed possibly starting a prophylactic agent with patient today however we deferred.  She will try the above lifestyle modifications first.  Return precautions reviewed.

## 2017-08-16 NOTE — Patient Instructions (Signed)
Try the recommendations we discussed.  Please let me know if your blood pressure is persistently 140/90 or higher.  Let me know if the headach is not improving.  Take care,  Dr Jerline Pain

## 2017-08-16 NOTE — Progress Notes (Signed)
Subjective:  Sharon Gardner is a 26 y.o. female who presents today with a chief complaint of headaches and to establish care.   HPI:  Headaches, new problem Chronic problem for patient that has recently worsened over the past month.  Now is having nearly daily headache.  Pain is typically located behind her eyes and in a frontal distribution.  No photophobia.  No scotoma.  No weakness or numbness.  She usually takes Excedrin was helps.  She has also tried Tylenol and ibuprofen which also helps some.  She recently took a job at [employer] where she is having to look at computer screens for almost 8 hours a day which she thinks is largely contributing.  She had an eye exam yesterday which was normal.  She drinks caffeine every day.  ROS: Per HPI, otherwise a 14 point review of systems was performed and was negative  PMH:  The following were reviewed and entered/updated in epic: Past Medical History:  Diagnosis Date  . Allergy   . Medical history non-contributory   . Pregnancy induced hypertension    Patient Active Problem List   Diagnosis Date Noted  . Headache 08/16/2017  . S/P primary low transverse C-section 02/11/2017  . History of gestational hypertension 02/10/2017   Past Surgical History:  Procedure Laterality Date  . CESAREAN SECTION N/A 02/11/2017   Procedure: CESAREAN SECTION;  Surgeon: Paula Compton, MD;  Location: [facility];  Service: Obstetrics;  Laterality: N/A;  . TONSILLECTOMY    . WISDOM TOOTH EXTRACTION      Family History  Problem Relation Age of Onset  . Cancer Mother   . Cancer Paternal Grandmother   . Miscarriages / Stillbirths Paternal Grandmother     Medications- reviewed and updated Current Outpatient Medications  Medication Sig Dispense Refill  . norethindrone (MICRONOR,CAMILA,ERRIN) 0.35 MG tablet Take 1 tablet by mouth daily.    . Prenatal Vit-Fe Fumarate-FA (PRENATAL MULTIVITAMIN) TABS tablet Take 1 tablet by mouth daily at 12  noon.    Marland Kitchen acetaminophen (TYLENOL) 325 MG tablet Take 650 mg by mouth every 6 (six) hours as needed for mild pain or headache.    . ibuprofen (ADVIL,MOTRIN) 600 MG tablet Take 1 tablet (600 mg total) by mouth every 6 (six) hours as needed for mild pain. (Patient not taking: Reported on 08/16/2017) 30 tablet 0   No current facility-administered medications for this visit.     Allergies-reviewed and updated Allergies  Allergen Reactions  . Amoxicillin Rash    Has patient had a PCN reaction causing immediate rash, facial/tongue/throat swelling, SOB or lightheadedness with hypotension: No Has patient had a PCN reaction causing severe rash involving mucus membranes or skin necrosis: No Has patient had a PCN reaction that required hospitalization No Has patient had a PCN reaction occurring within the last 10 years: Yes If all of the above answers are "NO", then may proceed with Cephalosporin use.     Social History   Socioeconomic History  . Marital status: Married    Spouse name: None  . Number of children: 1  . Years of education: None  . Highest education level: None  Social Needs  . Financial resource strain: None  . Food insecurity - worry: None  . Food insecurity - inability: None  . Transportation needs - medical: None  . Transportation needs - non-medical: None  Occupational History  . Occupation: Paediatric nurse: [employer]  Tobacco Use  . Smoking status: Never Smoker  .  Smokeless tobacco: Never Used  Substance and Sexual Activity  . Alcohol use: No  . Drug use: No  . Sexual activity: Yes    Birth control/protection: None  Other Topics Concern  . None  Social History Narrative  . None   Objective:  Physical Exam: BP 120/78 (BP Location: Left Arm, Cuff Size: Normal)   Pulse (!) 105   Ht 5' 4.5" (1.638 m)   Wt 136 lb 3.2 oz (61.8 kg)   SpO2 98%   BMI 23.02 kg/m   Gen: NAD, resting comfortably HEENT: Maxillary sinuses with mildly decreased  trans-illumination bilaterally. CV: RRR with no murmurs appreciated Pulm: NWOB, CTAB with no crackles, wheezes, or rhonchi GI: Normal bowel sounds present. Soft, Nontender, Nondistended. MSK: No edema, cyanosis, or clubbing noted Skin: Warm, dry Neuro: Cranial nerves through 12 intact.  Strength 5 out of 5 in upper and lower extremities.  Sensation light touch intact throughout.  Biceps, patellar, Achilles reflexes 2+ and symmetric bilaterally. Psych: Normal affect and thought content  Assessment/Plan:  Headache Seems to be most consistent with tension type headaches.  No red flag signs or symptoms on exam.  Her neurological exam is normal.  She has a few factors working against her-she is taking an analgesic nearly every day.  She is also using caffeine nearly daily.  Discussed lifestyle interventions with patient including avoidance of analgesics and use of no more than once or twice per week.  Also discussed role of good sleep and good oral hydration.  Discussed need to take breaks every 30 minutes to an hour to rest eyes while at work.  Patient for medication- given that she is breast-feeding, there is no good medication for this.  Advised her to use ibuprofen or Tylenol as needed sparingly as noted above.  Discussed possibly starting a prophylactic agent with patient today however we deferred.  She will try the above lifestyle modifications first.  Return precautions reviewed.  History of gestational hypertension Blood pressure goal today.  Continue checking blood pressure at home with goal 140/90 or less.  Preventive healthcare Flu shot given today.  Algis Greenhouse. Jerline Pain, MD 08/16/2017 5:05 PM

## 2018-04-06 DIAGNOSIS — Z01419 Encounter for gynecological examination (general) (routine) without abnormal findings: Secondary | ICD-10-CM | POA: Diagnosis not present

## 2018-04-06 DIAGNOSIS — Z130 Encounter for screening for diseases of the blood and blood-forming organs and certain disorders involving the immune mechanism: Secondary | ICD-10-CM | POA: Diagnosis not present

## 2018-04-06 DIAGNOSIS — Z6824 Body mass index (BMI) 24.0-24.9, adult: Secondary | ICD-10-CM | POA: Diagnosis not present

## 2018-05-17 ENCOUNTER — Emergency Department: Payer: 59

## 2018-05-17 ENCOUNTER — Other Ambulatory Visit: Payer: Self-pay

## 2018-05-17 ENCOUNTER — Emergency Department
Admission: EM | Admit: 2018-05-17 | Discharge: 2018-05-17 | Disposition: A | Discharge status: Home / Self Care | Payer: 59 | Attending: Emergency Medicine | Admitting: Emergency Medicine

## 2018-05-17 DIAGNOSIS — R102 Pelvic and perineal pain: Secondary | ICD-10-CM | POA: Diagnosis not present

## 2018-05-17 DIAGNOSIS — Z79899 Other long term (current) drug therapy: Secondary | ICD-10-CM | POA: Insufficient documentation

## 2018-05-17 DIAGNOSIS — R109 Unspecified abdominal pain: Secondary | ICD-10-CM | POA: Diagnosis not present

## 2018-05-17 DIAGNOSIS — R1084 Generalized abdominal pain: Secondary | ICD-10-CM | POA: Diagnosis not present

## 2018-05-17 DIAGNOSIS — N83201 Unspecified ovarian cyst, right side: Secondary | ICD-10-CM | POA: Insufficient documentation

## 2018-05-17 LAB — URINALYSIS, COMPLETE (UACMP) WITH MICROSCOPIC
Bacteria, UA: NONE SEEN
Bilirubin Urine: NEGATIVE
Glucose, UA: NEGATIVE mg/dL
Hgb urine dipstick: NEGATIVE
Ketones, ur: NEGATIVE mg/dL
Leukocytes, UA: NEGATIVE
Nitrite: NEGATIVE
Protein, ur: NEGATIVE mg/dL
Specific Gravity, Urine: 1.023 (ref 1.005–1.030)
pH: 5 (ref 5.0–8.0)

## 2018-05-17 LAB — COMPREHENSIVE METABOLIC PANEL
ALBUMIN: 4 g/dL (ref 3.5–5.0)
ALK PHOS: 50 U/L (ref 38–126)
ALT: 15 U/L (ref 0–44)
AST: 23 U/L (ref 15–41)
Anion gap: 8 (ref 5–15)
BILIRUBIN TOTAL: 0.5 mg/dL (ref 0.3–1.2)
BUN: 10 mg/dL (ref 6–20)
CO2: 25 mmol/L (ref 22–32)
CREATININE: 0.48 mg/dL (ref 0.44–1.00)
Calcium: 9.2 mg/dL (ref 8.9–10.3)
Chloride: 106 mmol/L (ref 98–111)
GFR calc Af Amer: >60 mL/min (ref >60)
GLUCOSE: 102 mg/dL — AB (ref 70–99)
Potassium: 3.9 mmol/L (ref 3.5–5.1)
Sodium: 139 mmol/L (ref 135–145)
TOTAL PROTEIN: 7.4 g/dL (ref 6.5–8.1)

## 2018-05-17 LAB — CBC
HCT: 40.9 % (ref 35.0–47.0)
Hemoglobin: 14.2 g/dL (ref 12.0–16.0)
MCH: 29.2 pg (ref 26.0–34.0)
MCHC: 34.6 g/dL (ref 32.0–36.0)
MCV: 84.3 fL (ref 80.0–100.0)
PLATELETS: 254 10*3/uL (ref 150–440)
RBC: 4.85 MIL/uL (ref 3.80–5.20)
RDW: 13.4 % (ref 11.5–14.5)
WBC: 8.3 10*3/uL (ref 3.6–11.0)

## 2018-05-17 LAB — POCT PREGNANCY, URINE: PREG TEST UR: NEGATIVE

## 2018-05-17 LAB — LIPASE, BLOOD: Lipase: 47 U/L (ref 11–51)

## 2018-05-17 MED ORDER — OXYCODONE-ACETAMINOPHEN 5-325 MG PO TABS: 1.0000 | ORAL_TABLET | Freq: Three times a day (TID) | ORAL | 0 refills | Status: DC | PRN

## 2018-05-17 MED ORDER — IOPAMIDOL (ISOVUE-300) INJECTION 61%
100.0000 mL | Freq: Once | INTRAVENOUS | Status: AC | PRN
  Administered 2018-05-17: 100 mL via INTRAVENOUS

## 2018-05-17 NOTE — ED Provider Notes (Signed)
Pelvic ultrasound IMPRESSION: Probable recently ruptured hemorrhagic right ovarian cyst. Small amount of free pelvic fluid. No abnormal hypervascularity to suggest an ectopic pregnancy. Normal vascular pattern of both ovaries. Follow-up ultrasound in 8-12 weeks is recommended.  Normal appearing uterus and endometrium.  Patient with ruptured ovarian cyst but otherwise benign examination at this time.  She is cleared for outpatient follow-up with her doctor.   Earleen Newport, MD 05/17/18 [phone]

## 2018-05-17 NOTE — ED Notes (Signed)
Patient transported to CT 

## 2018-05-17 NOTE — ED Provider Notes (Signed)
[facility] Emergency Department Provider Note   None    (approximate)  I have reviewed the triage vital signs and the nursing notes.   HISTORY  Chief Complaint Abdominal Pain    HPI Sharon Gardner is a 27 y.o. female presents to the emergency department with generalized abdominal pain x20 minutes.  Patient denies any nausea vomiting diarrhea or constipation.  Patient denies any dyspnea.  Patient denies any fever or urinary symptoms   Past Medical History:  Diagnosis Date  . Allergy   . Medical history non-contributory   . Pregnancy induced hypertension     Patient Active Problem List   Diagnosis Date Noted  . Headache 08/16/2017  . S/P primary low transverse C-section 02/11/2017  . History of gestational hypertension 02/10/2017    Past Surgical History:  Procedure Laterality Date  . CESAREAN SECTION N/A 02/11/2017   Procedure: CESAREAN SECTION;  Surgeon: Paula Compton, MD;  Location: [place];  Service: Obstetrics;  Laterality: N/A;  . TONSILLECTOMY    . WISDOM TOOTH EXTRACTION      Prior to Admission medications   Medication Sig Start Date End Date Taking? Authorizing Provider  acetaminophen (TYLENOL) 325 MG tablet Take 650 mg by mouth every 6 (six) hours as needed for mild pain or headache.   Yes [provider]  JUNEL FE 1.5/30 1.5-30 MG-MCG tablet Take 1 tablet by mouth daily. 05/10/18  Yes [provider]  norethindrone (MICRONOR,CAMILA,ERRIN) 0.35 MG tablet Take 1 tablet by mouth daily.   Yes [provider]  ibuprofen (ADVIL,MOTRIN) 600 MG tablet Take 1 tablet (600 mg total) by mouth every 6 (six) hours as needed for mild pain. Patient not taking: Reported on 08/16/2017 02/13/17   Meisinger, Sherren Mocha, MD  oxyCODONE-acetaminophen (PERCOCET) 5-325 MG tablet Take 1 tablet by mouth every 8 (eight) hours as needed. 05/17/18   Earleen Newport, MD    Allergies Amoxicillin  Family History  Problem  Relation Age of Onset  . Cancer Mother   . Cancer Paternal Grandmother   . Miscarriages / Stillbirths Paternal Grandmother     Social History Social History   Tobacco Use  . Smoking status: Never Smoker  . Smokeless tobacco: Never Used  Substance Use Topics  . Alcohol use: No  . Drug use: No    Review of Systems Constitutional: No fever/chills Eyes: No visual changes. ENT: No sore throat. Cardiovascular: Denies chest pain. Respiratory: Denies shortness of breath. Gastrointestinal: No abdominal pain.  No nausea, no vomiting.  No diarrhea.  No constipation. Genitourinary: Negative for dysuria. Musculoskeletal: Negative for neck pain.  Negative for back pain. Integumentary: Negative for rash. Neurological: Negative for headaches, focal weakness or numbness.   ____________________________________________   PHYSICAL EXAM:  VITAL SIGNS: ED Triage Vitals  Enc Vitals Group     BP 05/17/18 0541 116/86     Pulse Rate 05/17/18 0541 83     Resp 05/17/18 0541 17     Temp 05/17/18 0541 98.1 F (36.7 C)     Temp Source 05/17/18 0541 Oral     SpO2 05/17/18 0541 97 %     Weight 05/17/18 0539 66.2 kg (146 lb)     Height 05/17/18 0539 1.626 m (5\' 4" )     Head Circumference --      Peak Flow --      Pain Score 05/17/18 0539 4     Pain Loc --      Pain Edu? --  Excl. in [place]? --     Constitutional: Alert and oriented. Well appearing and in no acute distress. Eyes: Conjunctivae are normal.  Head: Atraumatic. Mouth/Throat: Mucous membranes are moist. Oropharynx non-erythematous. Neck: No stridor.   Cardiovascular: Normal rate, regular rhythm. Good peripheral circulation. Grossly normal heart sounds. Respiratory: Normal respiratory effort.  No retractions. Lungs CTAB. Gastrointestinal: Positive for right upper quadrant, right lower and left lower quadrant tenderness to palpation.  Quadrant tenderness to palpation. Musculoskeletal: No lower extremity tenderness nor edema. No  gross deformities of extremities. Neurologic:  Normal speech and language. No gross focal neurologic deficits are appreciated.  Skin:  Skin is warm, dry and intact. No rash noted. Psychiatric: Mood and affect are normal. Speech and behavior are normal.  ____________________________________________   LABS (all labs ordered are listed, but only abnormal results are displayed)  Labs Reviewed  COMPREHENSIVE METABOLIC PANEL - Abnormal; Notable for the following components:      Result Value   Glucose, Bld 102 (*)    All other components within normal limits  URINALYSIS, COMPLETE (UACMP) WITH MICROSCOPIC - Abnormal; Notable for the following components:   Color, Urine YELLOW (*)    APPearance CLOUDY (*)    All other components within normal limits  LIPASE, BLOOD  CBC  POC URINE PREG, ED  POCT PREGNANCY, URINE    RADIOLOGY I, Coppock N Kailin Leu, personally viewed and evaluated these images (plain radiographs) as part of my medical decision making, as well as reviewing the written report by the radiologist.  ED MD interpretation: Possible recent rupture of right hemorrhagic cysts per CT scan  Official radiology report(s): US Pelvis Transvanginal Non-ob (tv Only)  Result Date: 05/17/2018 CLINICAL DATA:  Pelvic pain began at 5 a.m. today. It is now subsided. Patient was recently pregnant. EXAM: TRANSABDOMINAL AND TRANSVAGINAL ULTRASOUND OF PELVIS DOPPLER ULTRASOUND OF OVARIES TECHNIQUE: Both transabdominal and transvaginal ultrasound examinations of the pelvis were performed. Transabdominal technique was performed for global imaging of the pelvis including uterus, ovaries, adnexal regions, and pelvic cul-de-sac. It was necessary to proceed with endovaginal exam following the transabdominal exam to visualize the ovaries and adnexal regions. Color and duplex Doppler ultrasound was utilized to evaluate blood flow to the ovaries. COMPARISON:  Abdominal and pelvic CT scan of today's date FINDINGS:  Uterus Measurements: 7.7 x 4.2 x 5.6 cm. No fibroids or other mass visualized. Endometrium Thickness: 8.7 mm. There is a trace of fluid within the endometrial canal. Right ovary Measurements: 4.2 x 1.8 x 2.0 cm. There is a complex cystic component adjacent to the right ovary with a small amount of adjacent free fluid measuring 4.3 x 2.4 x 2.9 cm. Left ovary Measurements: 3.2 x 1.6 x 1.9 cm. Normal appearance/no adnexal mass. Pulsed Doppler evaluation of both ovaries demonstrates normal low-resistance arterial and venous waveforms. Other findings:There is a small amount of free fluid in the cul-de-sac with trace amounts about both ovaries. IMPRESSION: Probable recently ruptured hemorrhagic right ovarian cyst. Small amount of free pelvic fluid. No abnormal hypervascularity to suggest an ectopic pregnancy. Normal vascular pattern of both ovaries. Follow-up ultrasound in 8-12 weeks is recommended. Normal appearing uterus and endometrium. Electronically Signed   By: David  Martinique M.D.   On: 05/17/2018 09:21   US Pelvis Complete  Result Date: 05/17/2018 CLINICAL DATA:  Pelvic pain began at 5 a.m. today. It is now subsided. Patient was recently pregnant. EXAM: TRANSABDOMINAL AND TRANSVAGINAL ULTRASOUND OF PELVIS DOPPLER ULTRASOUND OF OVARIES TECHNIQUE: Both transabdominal and transvaginal ultrasound examinations of  the pelvis were performed. Transabdominal technique was performed for global imaging of the pelvis including uterus, ovaries, adnexal regions, and pelvic cul-de-sac. It was necessary to proceed with endovaginal exam following the transabdominal exam to visualize the ovaries and adnexal regions. Color and duplex Doppler ultrasound was utilized to evaluate blood flow to the ovaries. COMPARISON:  Abdominal and pelvic CT scan of today's date FINDINGS: Uterus Measurements: 7.7 x 4.2 x 5.6 cm. No fibroids or other mass visualized. Endometrium Thickness: 8.7 mm. There is a trace of fluid within the endometrial  canal. Right ovary Measurements: 4.2 x 1.8 x 2.0 cm. There is a complex cystic component adjacent to the right ovary with a small amount of adjacent free fluid measuring 4.3 x 2.4 x 2.9 cm. Left ovary Measurements: 3.2 x 1.6 x 1.9 cm. Normal appearance/no adnexal mass. Pulsed Doppler evaluation of both ovaries demonstrates normal low-resistance arterial and venous waveforms. Other findings:There is a small amount of free fluid in the cul-de-sac with trace amounts about both ovaries. IMPRESSION: Probable recently ruptured hemorrhagic right ovarian cyst. Small amount of free pelvic fluid. No abnormal hypervascularity to suggest an ectopic pregnancy. Normal vascular pattern of both ovaries. Follow-up ultrasound in 8-12 weeks is recommended. Normal appearing uterus and endometrium. Electronically Signed   By: David  Martinique M.D.   On: 05/17/2018 09:21   US Pelvic Doppler (torsion R/o Or Mass Arterial Flow)  Result Date: 05/17/2018 CLINICAL DATA:  Pelvic pain began at 5 a.m. today. It is now subsided. Patient was recently pregnant. EXAM: TRANSABDOMINAL AND TRANSVAGINAL ULTRASOUND OF PELVIS DOPPLER ULTRASOUND OF OVARIES TECHNIQUE: Both transabdominal and transvaginal ultrasound examinations of the pelvis were performed. Transabdominal technique was performed for global imaging of the pelvis including uterus, ovaries, adnexal regions, and pelvic cul-de-sac. It was necessary to proceed with endovaginal exam following the transabdominal exam to visualize the ovaries and adnexal regions. Color and duplex Doppler ultrasound was utilized to evaluate blood flow to the ovaries. COMPARISON:  Abdominal and pelvic CT scan of today's date FINDINGS: Uterus Measurements: 7.7 x 4.2 x 5.6 cm. No fibroids or other mass visualized. Endometrium Thickness: 8.7 mm. There is a trace of fluid within the endometrial canal. Right ovary Measurements: 4.2 x 1.8 x 2.0 cm. There is a complex cystic component adjacent to the right ovary with a  small amount of adjacent free fluid measuring 4.3 x 2.4 x 2.9 cm. Left ovary Measurements: 3.2 x 1.6 x 1.9 cm. Normal appearance/no adnexal mass. Pulsed Doppler evaluation of both ovaries demonstrates normal low-resistance arterial and venous waveforms. Other findings:There is a small amount of free fluid in the cul-de-sac with trace amounts about both ovaries. IMPRESSION: Probable recently ruptured hemorrhagic right ovarian cyst. Small amount of free pelvic fluid. No abnormal hypervascularity to suggest an ectopic pregnancy. Normal vascular pattern of both ovaries. Follow-up ultrasound in 8-12 weeks is recommended. Normal appearing uterus and endometrium. Electronically Signed   By: David  Martinique M.D.   On: 05/17/2018 09:21     Procedures   ____________________________________________   INITIAL IMPRESSION / ASSESSMENT AND PLAN / ED COURSE  As part of my medical decision making, I reviewed the following data within the electronic MEDICAL RECORD NUMBER   27 year old female presented with generalized abdominal discomfort as stated above.  Concern for appendicitis ovarian cysts and gallbladder disease given generalized location of the patient's pain and multiple clinical possibilities CT scan of the abdomen and pelvis performed.  Patient's care transferred to Dr. Jimmye Norman  Clinical Course as of May 18 1101  Wed May 17, 2018  0753 CT reveals likely ruptured ovarian cyst, we will proceed with ultrasound   [JW]    Clinical Course User Index [JW] Earleen Newport, MD    ____________________________________________  FINAL CLINICAL IMPRESSION(S) / ED DIAGNOSES  Final diagnoses:  Pelvic pain in female  Cyst of right ovary     MEDICATIONS GIVEN DURING THIS VISIT:  Medications  iopamidol (ISOVUE-300) 61 % injection 100 mL (100 mLs Intravenous Contrast Given 05/17/18 0631)     ED Discharge Orders         Ordered    oxyCODONE-acetaminophen (PERCOCET) 5-325 MG tablet  Every 8 hours PRN       05/17/18 1117           Note:  This document was prepared using Dragon voice recognition software and may include unintentional dictation errors.    Gregor Hams, MD 05/18/18 [phone]

## 2018-05-17 NOTE — ED Notes (Signed)
Patient transported to Ultrasound 

## 2018-05-17 NOTE — ED Notes (Signed)
Pt alert and oriented X 3. NAD. Respirations unlabored. No pain at this time.

## 2018-05-17 NOTE — ED Triage Notes (Signed)
Pt arrives to ED via POV from home with c/o generalized abdominal pain x20 mins PTA. Pt denies N/V/D, no SHOB, CP or fever.

## 2018-08-16 ENCOUNTER — Encounter: Payer: 59 | Admitting: Family Medicine

## 2018-10-03 DIAGNOSIS — F411 Generalized anxiety disorder: Secondary | ICD-10-CM | POA: Diagnosis not present

## 2018-10-15 DIAGNOSIS — K0889 Other specified disorders of teeth and supporting structures: Secondary | ICD-10-CM | POA: Diagnosis not present

## 2019-03-19 DIAGNOSIS — M791 Myalgia, unspecified site: Secondary | ICD-10-CM | POA: Diagnosis not present

## 2019-03-19 DIAGNOSIS — Z20828 Contact with and (suspected) exposure to other viral communicable diseases: Secondary | ICD-10-CM | POA: Diagnosis not present

## 2019-03-19 DIAGNOSIS — R51 Headache: Secondary | ICD-10-CM | POA: Diagnosis not present

## 2019-04-25 ENCOUNTER — Encounter: Payer: Self-pay | Admitting: Family Medicine

## 2019-04-25 ENCOUNTER — Other Ambulatory Visit: Payer: Self-pay

## 2019-04-25 ENCOUNTER — Ambulatory Visit: Payer: BC Managed Care – PPO | Admitting: Family Medicine

## 2019-04-25 VITALS — BP 108/80 | HR 85 | Temp 99.1°F | Ht 65.0 in | Wt 139.8 lb

## 2019-04-25 DIAGNOSIS — N938 Other specified abnormal uterine and vaginal bleeding: Secondary | ICD-10-CM

## 2019-04-25 DIAGNOSIS — G44219 Episodic tension-type headache, not intractable: Secondary | ICD-10-CM

## 2019-04-25 DIAGNOSIS — Z7689 Persons encountering health services in other specified circumstances: Secondary | ICD-10-CM

## 2019-04-25 HISTORY — DX: Other specified abnormal uterine and vaginal bleeding: N93.8

## 2019-04-25 NOTE — Progress Notes (Signed)
Subjective:    Patient ID: Sharon Gardner, female    DOB: Oct 29, 1990, 29 y.o.   MRN: [id number]  HPI This is a 28 yo female who presents today to establish care. Previous patient Dr. Jerline Pain. Married and has a 32 yo daughter. Works in Orthoptist at [employer].   Last CPE- gyn Pap- sees gyn Tdap- 12/03/2016 Flu- annual Eye- regular Dental- regular Exercise- regular Sleep- feels rested  Headaches- for as long as she can remember. Most days. No triggers. Felt like her vision was "bright" x 1 last week. Usually headaches are around her eyes and temples. Takes acetaminophen x 2 or advil 400 mg which usually helps. Clearing of headache usually 50% of time. Taking otc analgesics 3-4 times a week. Headaches usually occur mid morning or late afternoon. Poor water intake. Was seen by previous PCP for same symptoms 08/16/2017.   Has been on Junel x 1 year. More headaches during pill free week. Has irregular periods. Has appointment with gyn 2023-08-02.   Mother passed away from pancreatic cancer at age 31. Grandmother with breast cancer, ? Gall bladder cancer.   Past Medical History:  Diagnosis Date  . Allergy   . Medical history non-contributory   . Pregnancy induced hypertension    Past Surgical History:  Procedure Laterality Date  . CESAREAN SECTION N/A 02/11/2017   Procedure: CESAREAN SECTION;  Surgeon: Paula Compton, MD;  Location: [place];  Service: Obstetrics;  Laterality: N/A;  . TONSILLECTOMY    . WISDOM TOOTH EXTRACTION     Family History  Problem Relation Age of Onset  . Cancer Mother   . Cancer Paternal Grandmother   . Miscarriages / Stillbirths Paternal Grandmother    Social History   Tobacco Use  . Smoking status: Never Smoker  . Smokeless tobacco: Never Used  Substance Use Topics  . Alcohol use: No  . Drug use: No     Review of Systems Per HPI    Objective:   Physical Exam Physical Exam  Vitals reviewed. Constitutional: Oriented to  person, place, and time. Appears well-developed and well-nourished.  HENT:  Head: Normocephalic and atraumatic.  Eyes: Conjunctivae are normal.  Neck: Normal range of motion. Neck supple.  Cardiovascular: Normal rate.   Pulmonary/Chest: Effort normal.  Musculoskeletal: Normal range of motion.  Neurological: Alert and oriented to person, place, and time.  Psychiatric: Normal mood and affect. Behavior is normal. Judgment and thought content normal.    BP 108/80 (BP Location: Left Arm, Patient Position: Sitting, Cuff Size: Normal)   Pulse 85   Temp 99.1 F (37.3 C) (Temporal)   Ht 5\' 5"  (1.651 m)   Wt 139 lb 12.8 oz (63.4 kg)   LMP 04/09/2019 (Exact Date)   SpO2 98%   BMI 23.26 kg/m  Wt Readings from Last 3 Encounters:  04/25/19 139 lb 12.8 oz (63.4 kg)  05/17/18 146 lb (66.2 kg)  08/16/17 136 lb 3.2 oz (61.8 kg)       Assessment & Plan:  1. Encounter to establish care - Discussed and encouraged healthy lifestyle choices- adequate sleep, regular exercise, stress management and healthy food choices.   2. Dysfunctional uterine bleeding - follow up with gyn, will check labs for metabolic abnormalities - TSH - Comprehensive metabolic panel - CBC with Differential  3. Episodic tension-type headache, not intractable - unknown triggers, asked her to keep a headache log and send me results in 3-4 weeks, encouraged increased hydration - consider starting prophylaxis if no improvement with  lifestyle changes    Clarene Reamer, FNP-BC  [facility]  04/25/2019 4:36 PM

## 2019-04-25 NOTE — Patient Instructions (Addendum)
Good to see you today  Increase your water intake.   Please keep a log of your headaches. Date, time, how long they last, any triggers?   I will notify you of lab results  Send me a my chart message in a couple of weeks letting me know how your headaches are doing and we can discuss preventative treatments.    Tension Headache, Adult A tension headache is a feeling of pain, pressure, or aching in the head that is often felt over the front and sides of the head. The pain can be dull, or it can feel tight (constricting). There are two types of tension headache:  Episodic tension headache. This is when the headaches happen fewer than 15 days a month.  Chronic tension headache. This is when the headaches happen more than 15 days a month during a 84-month period. A tension headache can last from 30 minutes to several days. It is the most common kind of headache. Tension headaches are not normally associated with nausea or vomiting, and they do not get worse with physical activity. What are the causes? The exact cause of this condition is not known. Tension headaches are often triggered by stress, anxiety, or depression. Other triggers include:  Alcohol.  Too much caffeine or caffeine withdrawal.  Respiratory infections, such as colds, flu, or sinus infections.  Dental problems or teeth clenching.  Tiredness (fatigue).  Holding your head and neck in the same position for a long period of time, such as while using a computer.  Smoking.  Arthritis of the neck. What are the signs or symptoms? Symptoms of this condition include:  A feeling of pressure or tightness around the head.  Dull, aching head pain.  Pain over the front and sides of the head.  Tenderness in the muscles of the head, neck, and shoulders. How is this diagnosed? This condition may be diagnosed based on your symptoms, your medical history, and a physical exam. If your symptoms are severe or unusual, you may have  imaging tests, such as a CT scan or an MRI of your head. Your vision may also be checked. How is this treated? This condition may be treated with lifestyle changes and with medicines that help relieve symptoms. Follow these instructions at home: Managing pain  Take over-the-counter and prescription medicines only as told by your health care provider.  When you have a headache, lie down in a dark, quiet room.  If directed, apply ice to the head and neck: ? Put ice in a plastic bag. ? Place a towel between your skin and the bag. ? Leave the ice on for 20 minutes, 2-3 times a day.  If directed, apply heat to the back of your neck as often as told by your health care provider. Use the heat source that your health care provider recommends, such as a moist heat pack or a heating pad. ? Place a towel between your skin and the heat source. ? Leave the heat on for 20-30 minutes. ? Remove the heat if your skin turns bright red. This is especially important if you are unable to feel pain, heat, or cold. You may have a greater risk of getting burned. Eating and drinking  Eat meals on a regular schedule.  Limit alcohol intake to no more than 1 drink a day for nonpregnant women and 2 drinks a day for men. One drink equals 12 oz of beer, 5 oz of wine, or 1 oz of hard liquor.  Drink enough fluid to keep your urine pale yellow.  Decrease your caffeine intake, or stop using caffeine. Lifestyle  Get 7-9 hours of sleep each night, or get the amount of sleep recommended by your health care provider.  At bedtime, remove all electronic devices from your room. Electronic devices include computers, phones, and tablets.  Find ways to manage your stress. Some things that can help relieve stress include: ? Exercise. ? Deep breathing exercises. ? Yoga. ? Listening to music. ? Positive mental imagery.  Try to sit up straight and avoid tensing your muscles.  Do not use any products that contain nicotine  or tobacco, such as cigarettes and e-cigarettes. If you need help quitting, ask your health care provider. General instructions   Keep all follow-up visits as told by your health care provider. This is important.  Avoid any headache triggers. Keep a headache journal to help find out what may trigger your headaches. For example, write down: ? What you eat and drink. ? How much sleep you get. ? Any change to your diet or medicines. Contact a health care provider if:  Your headache does not get better.  Your headache comes back.  You are sensitive to sounds, light, or smells because of a headache.  You have nausea or you vomit.  Your stomach hurts. Get help right away if:  You suddenly develop a very severe headache along with any of the following: ? A stiff neck. ? Nausea and vomiting. ? Confusion. ? Weakness. ? Double vision or loss of vision. ? Shortness of breath. ? Rash. ? Unusual sleepiness. ? Fever. ? Trouble speaking. ? Pain in your eyes or ears. ? Trouble walking or balancing. ? Feeling faint or passing out. Summary  A tension headache is a feeling of pain, pressure, or aching in the head that is often felt over the front and sides of the head.  A tension headache can last from 30 minutes to several days. It is the most common kind of headache.  This condition may be diagnosed based on your symptoms, your medical history, and a physical exam.  This condition may be treated with lifestyle changes and with medicines that help relieve symptoms. This information is not intended to replace advice given to you by your health care provider. Make sure you discuss any questions you have with your health care provider. Document Released: 09/06/2005 Document Revised: 08/19/2017 Document Reviewed: 12/17/2016 Elsevier Patient Education  2020 [employer].

## 2019-04-26 LAB — CBC WITH DIFFERENTIAL/PLATELET
Basophils Absolute: 0.1 10*3/uL (ref 0.0–0.1)
Basophils Relative: 0.7 % (ref 0.0–3.0)
Eosinophils Absolute: 0 10*3/uL (ref 0.0–0.7)
Eosinophils Relative: 0.4 % (ref 0.0–5.0)
HCT: 42.5 % (ref 36.0–46.0)
Hemoglobin: 14.3 g/dL (ref 12.0–15.0)
Lymphocytes Relative: 31.7 % (ref 12.0–46.0)
Lymphs Abs: 2.5 10*3/uL (ref 0.7–4.0)
MCHC: 33.7 g/dL (ref 30.0–36.0)
MCV: 84.9 fl (ref 78.0–100.0)
Monocytes Absolute: 0.5 10*3/uL (ref 0.1–1.0)
Monocytes Relative: 6.7 % (ref 3.0–12.0)
Neutro Abs: 4.8 10*3/uL (ref 1.4–7.7)
Neutrophils Relative %: 60.5 % (ref 43.0–77.0)
Platelets: 309 10*3/uL (ref 150.0–400.0)
RBC: 5 Mil/uL (ref 3.87–5.11)
RDW: 13.1 % (ref 11.5–15.5)
WBC: 7.9 10*3/uL (ref 4.0–10.5)

## 2019-04-26 LAB — COMPREHENSIVE METABOLIC PANEL
ALT: 8 U/L (ref 0–35)
AST: 14 U/L (ref 0–37)
Albumin: 4.4 g/dL (ref 3.5–5.2)
Alkaline Phosphatase: 36 U/L — ABNORMAL LOW (ref 39–117)
BUN: 7 mg/dL (ref 6–23)
CO2: 26 mEq/L (ref 19–32)
Calcium: 9.4 mg/dL (ref 8.4–10.5)
Chloride: 103 mEq/L (ref 96–112)
Creatinine, Ser: 0.74 mg/dL (ref 0.40–1.20)
GFR: 93.02 mL/min (ref >60.00)
Glucose, Bld: 78 mg/dL (ref 70–99)
Potassium: 4.2 mEq/L (ref 3.5–5.1)
Sodium: 136 mEq/L (ref 135–145)
Total Bilirubin: 0.5 mg/dL (ref 0.2–1.2)
Total Protein: 7.7 g/dL (ref 6.0–8.3)

## 2019-04-26 LAB — TSH: TSH: 1.76 u[IU]/mL (ref 0.35–4.50)

## 2019-05-05 ENCOUNTER — Encounter: Payer: Self-pay | Admitting: Family Medicine

## 2019-07-12 DIAGNOSIS — Z124 Encounter for screening for malignant neoplasm of cervix: Secondary | ICD-10-CM | POA: Diagnosis not present

## 2019-07-12 DIAGNOSIS — Z01419 Encounter for gynecological examination (general) (routine) without abnormal findings: Secondary | ICD-10-CM | POA: Diagnosis not present

## 2019-07-12 DIAGNOSIS — Z130 Encounter for screening for diseases of the blood and blood-forming organs and certain disorders involving the immune mechanism: Secondary | ICD-10-CM | POA: Diagnosis not present

## 2019-07-12 DIAGNOSIS — Z1389 Encounter for screening for other disorder: Secondary | ICD-10-CM | POA: Diagnosis not present

## 2019-07-13 DIAGNOSIS — Z124 Encounter for screening for malignant neoplasm of cervix: Secondary | ICD-10-CM | POA: Diagnosis not present

## 2019-07-13 LAB — HM PAP SMEAR: HM Pap smear: NEGATIVE

## 2019-09-17 DIAGNOSIS — N911 Secondary amenorrhea: Secondary | ICD-10-CM | POA: Diagnosis not present

## 2019-09-17 DIAGNOSIS — Z3201 Encounter for pregnancy test, result positive: Secondary | ICD-10-CM | POA: Diagnosis not present

## 2019-09-17 DIAGNOSIS — Z23 Encounter for immunization: Secondary | ICD-10-CM | POA: Diagnosis not present

## 2019-10-01 DIAGNOSIS — Z3A10 10 weeks gestation of pregnancy: Secondary | ICD-10-CM | POA: Diagnosis not present

## 2019-10-01 DIAGNOSIS — Z363 Encounter for antenatal screening for malformations: Secondary | ICD-10-CM | POA: Diagnosis not present

## 2019-10-01 DIAGNOSIS — Z113 Encounter for screening for infections with a predominantly sexual mode of transmission: Secondary | ICD-10-CM | POA: Diagnosis not present

## 2019-10-01 DIAGNOSIS — O26891 Other specified pregnancy related conditions, first trimester: Secondary | ICD-10-CM | POA: Diagnosis not present

## 2019-10-01 DIAGNOSIS — Z3A08 8 weeks gestation of pregnancy: Secondary | ICD-10-CM | POA: Diagnosis not present

## 2019-10-01 DIAGNOSIS — Z3689 Encounter for other specified antenatal screening: Secondary | ICD-10-CM | POA: Diagnosis not present

## 2019-10-01 LAB — OB RESULTS CONSOLE ABO/RH: RH Type: POSITIVE

## 2019-10-01 LAB — OB RESULTS CONSOLE RUBELLA ANTIBODY, IGM: Rubella: IMMUNE

## 2019-10-01 LAB — OB RESULTS CONSOLE HEPATITIS B SURFACE ANTIGEN: Hepatitis B Surface Ag: NEGATIVE

## 2019-10-01 LAB — OB RESULTS CONSOLE RPR: RPR: NONREACTIVE

## 2019-10-01 LAB — OB RESULTS CONSOLE ANTIBODY SCREEN: Antibody Screen: NEGATIVE

## 2019-10-01 LAB — OB RESULTS CONSOLE GC/CHLAMYDIA
Chlamydia: NEGATIVE
Gonorrhea: NEGATIVE

## 2019-10-01 LAB — OB RESULTS CONSOLE HIV ANTIBODY (ROUTINE TESTING): HIV: NONREACTIVE

## 2019-10-30 DIAGNOSIS — Z3481 Encounter for supervision of other normal pregnancy, first trimester: Secondary | ICD-10-CM | POA: Diagnosis not present

## 2019-11-22 DIAGNOSIS — O139 Gestational [pregnancy-induced] hypertension without significant proteinuria, unspecified trimester: Secondary | ICD-10-CM | POA: Diagnosis not present

## 2019-12-14 DIAGNOSIS — Z3A19 19 weeks gestation of pregnancy: Secondary | ICD-10-CM | POA: Diagnosis not present

## 2019-12-14 DIAGNOSIS — Z363 Encounter for antenatal screening for malformations: Secondary | ICD-10-CM | POA: Diagnosis not present

## 2020-01-10 DIAGNOSIS — Z3A22 22 weeks gestation of pregnancy: Secondary | ICD-10-CM | POA: Diagnosis not present

## 2020-01-10 DIAGNOSIS — Z362 Encounter for other antenatal screening follow-up: Secondary | ICD-10-CM | POA: Diagnosis not present

## 2020-02-11 DIAGNOSIS — Z3689 Encounter for other specified antenatal screening: Secondary | ICD-10-CM | POA: Diagnosis not present

## 2020-02-11 DIAGNOSIS — Z23 Encounter for immunization: Secondary | ICD-10-CM | POA: Diagnosis not present

## 2020-02-15 IMAGING — CT CT ABD-PELV W/ CM
2 of 4 series · 15 of 46 positions shown, 17 images · IV contrast (APPLIED)
Comparison: None.

CLINICAL DATA: 26-year-old female with a history of abdominal pain
for 20 minutes

EXAM:
CT ABDOMEN AND PELVIS WITH CONTRAST
TECHNIQUE: Multidetector CT imaging of the abdomen and pelvis was performed
using the standard protocol following bolus administration of
intravenous contrast.
CONTRAST:  100mL WE4ODP-GTT IOPAMIDOL (WE4ODP-GTT) INJECTION 61%

[Series 2: routine abd/pel with · axial · 0.72mm/px · z∈[-885,-445]mm · 12 of 100 slices shown, 14 images]
[im 8/100  soft-tissue]
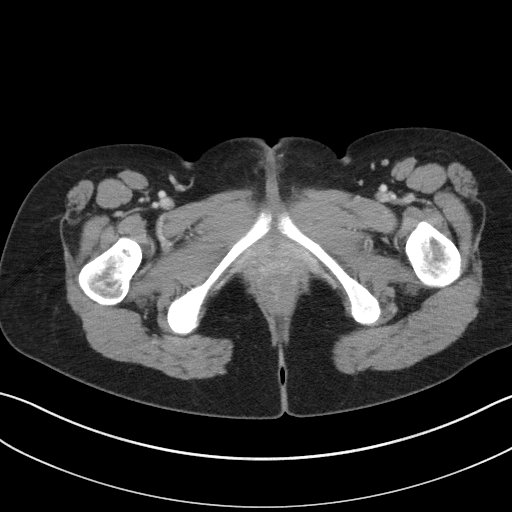
[im 8/100  bone]
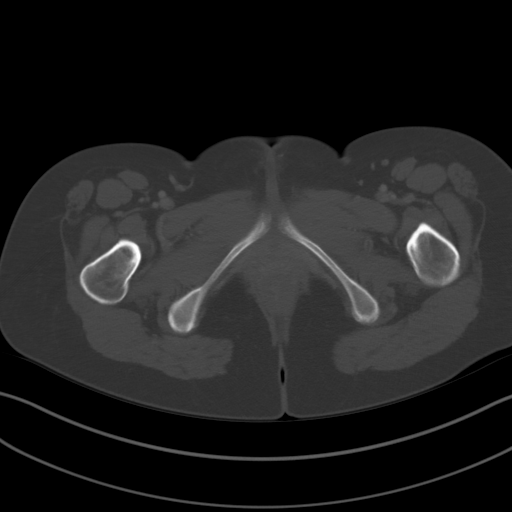
[im 16/100  soft-tissue]
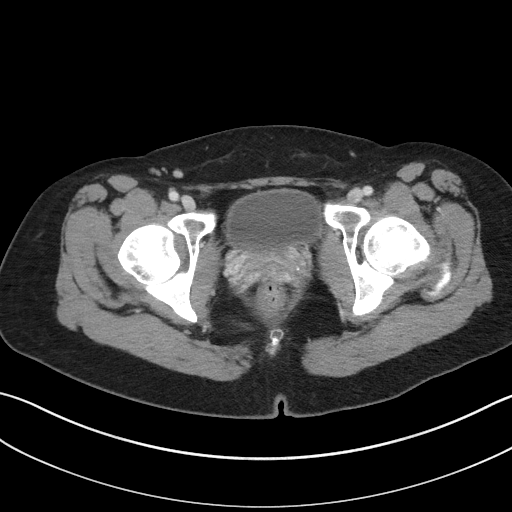
[im 24/100  soft-tissue]
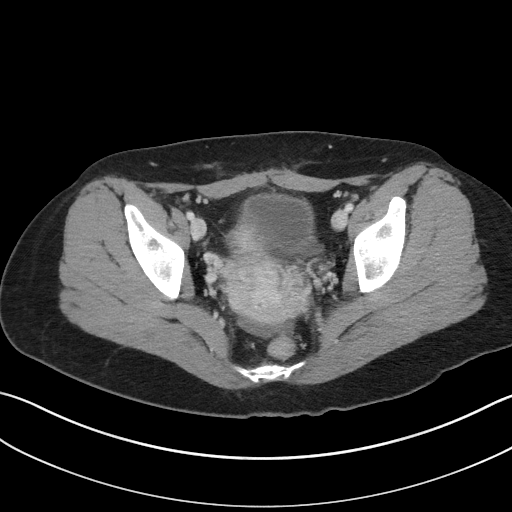
[im 32/100  soft-tissue]
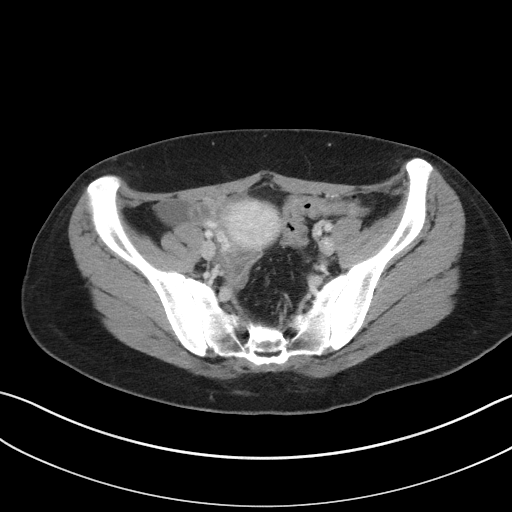
[im 40/100  soft-tissue]
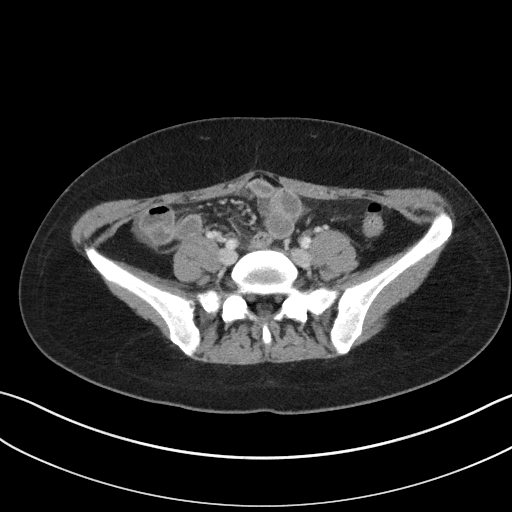
[im 48/100  soft-tissue]
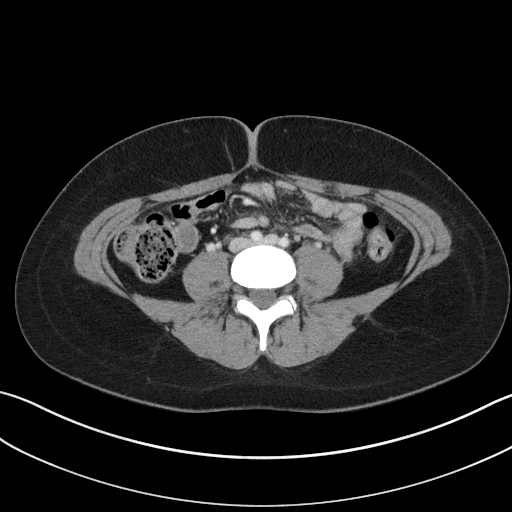
[im 56/100  soft-tissue]
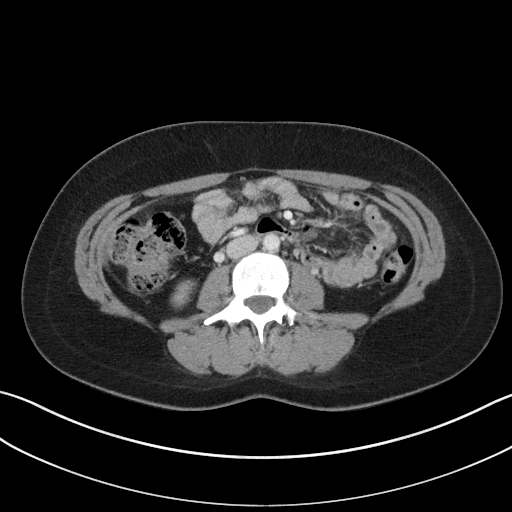
[im 64/100  soft-tissue]
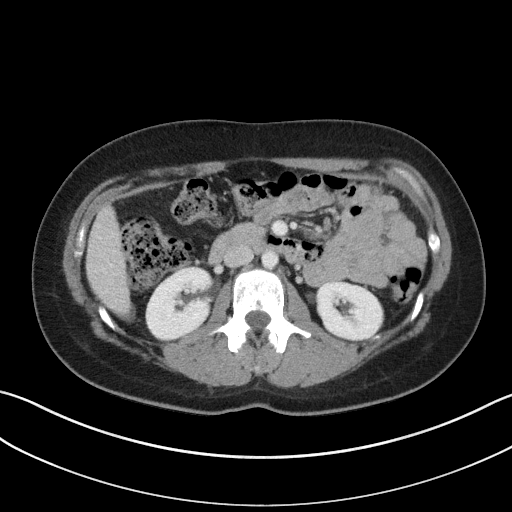
[im 72/100  soft-tissue]
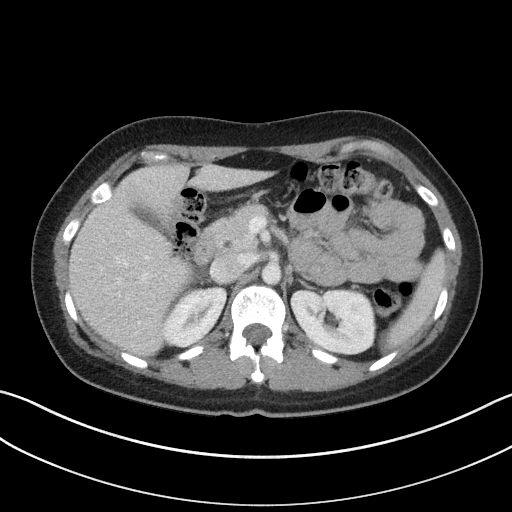
[im 72/100  bone]
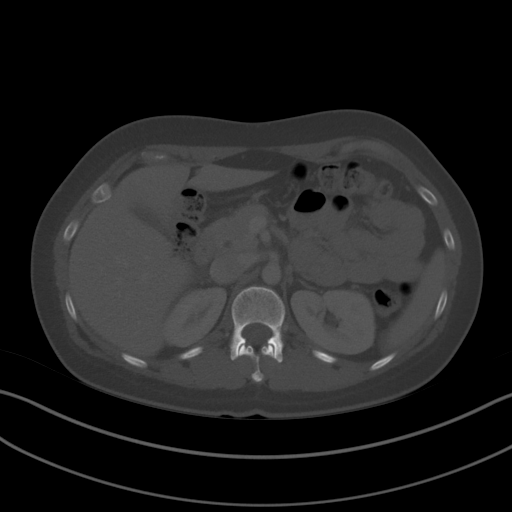
[im 80/100  soft-tissue]
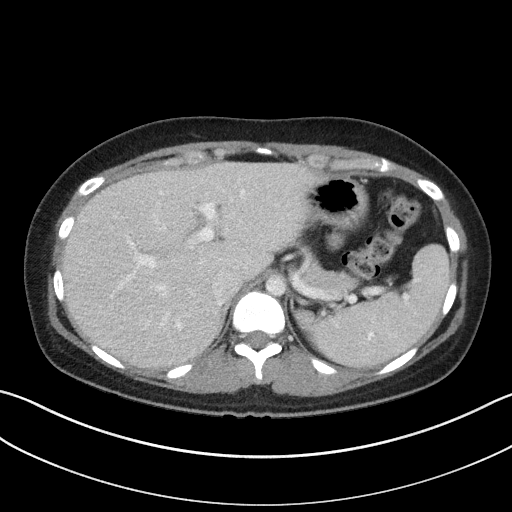
[im 88/100  soft-tissue]
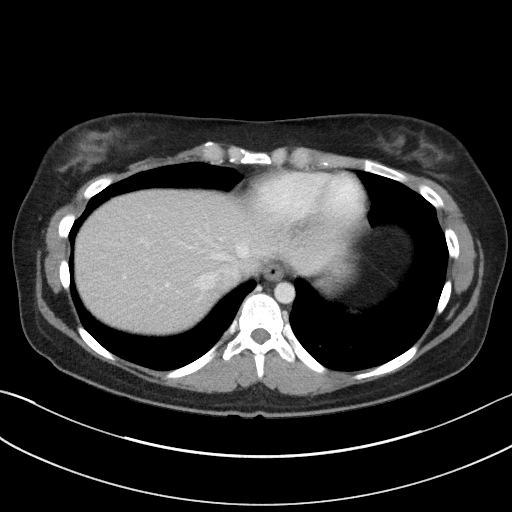
[im 96/100  soft-tissue]
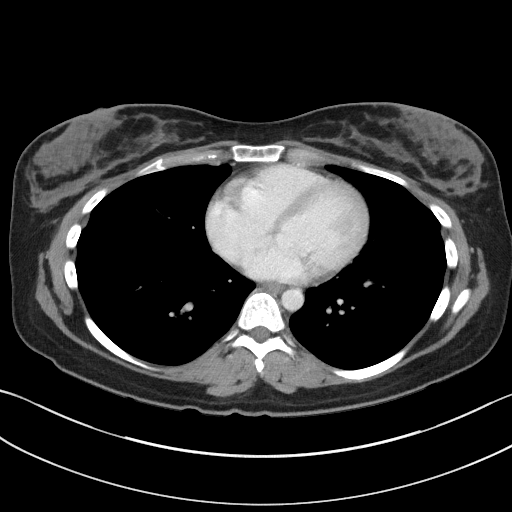

[Series 5: coronal st · coronal · 0.68mm/px · 3 of 68 slices shown]
[im 23/68  soft-tissue]
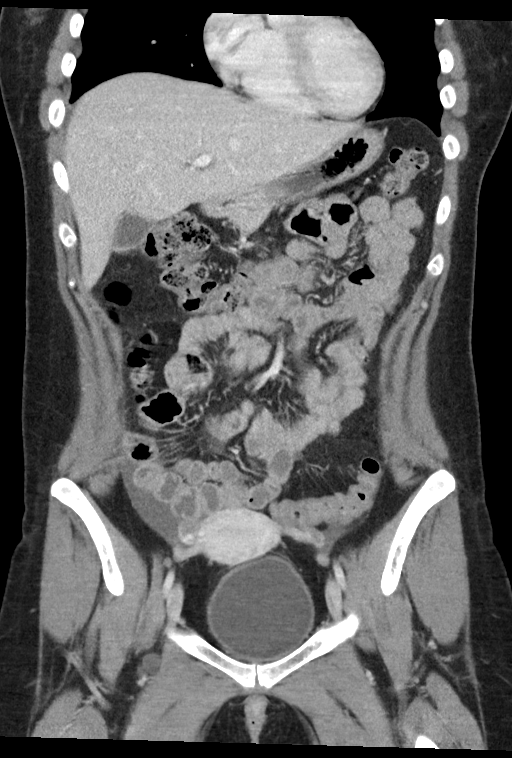
[im 30/68  soft-tissue]
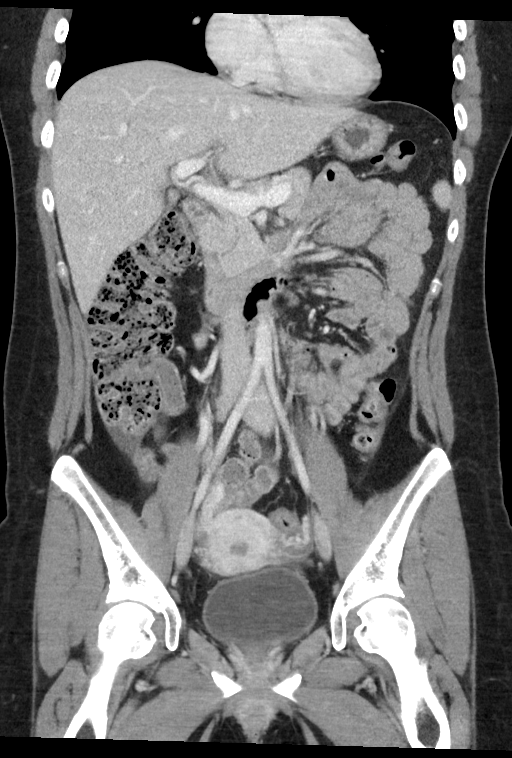
[im 38/68  soft-tissue]
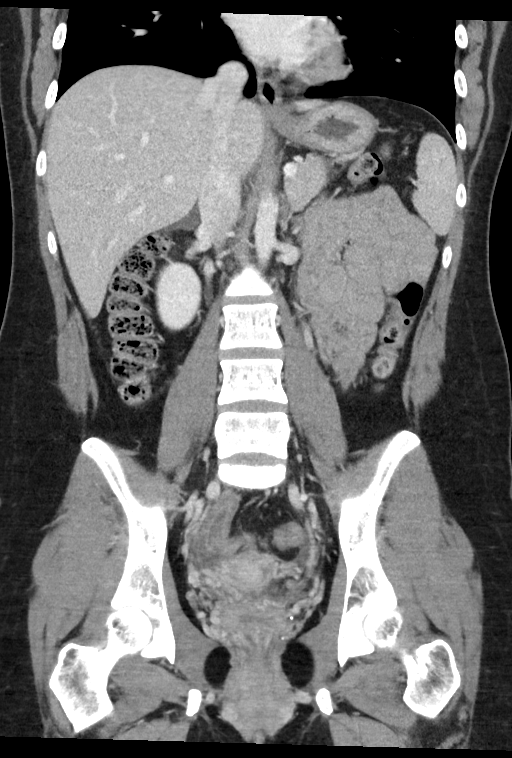

[15 of 46 positions shown; findings below may reference images not displayed]

FINDINGS: Lower chest: No acute abnormality.

Hepatobiliary: No focal liver abnormality is seen. No gallstones,
gallbladder wall thickening, or biliary dilatation.

Pancreas: Unremarkable pancreas

Spleen: Unremarkable spleen

Adrenals/Urinary Tract: Unremarkable appearance of the adrenal
glands. No evidence of hydronephrosis of the right or left kidney.
No nephrolithiasis. Unremarkable course of the bilateral ureters.
Unremarkable appearance of the urinary bladder.

Stomach/Bowel: Unremarkable stomach, small bowel. Normal appendix.
Unremarkable colon.

Vascular/Lymphatic: Unremarkable appearance of the aorta, mesenteric
arteries, renal arteries, iliac arteries, and visualized proximal
femoral arteries. Unremarkable appearance of the veins.

No lymphadenopathy.

Reproductive: There is a rim enhancing crenulated decompressed
structure associated with the right adnexa, with adjacent small
volume of free fluid next to the right adnexa, along the right broad
ligament and within the right pericolic gutter. Small volume of
fluid within the endometrial canal. Unremarkable appearance of the
left adnexa. Trace fluid within the recto uterine space.

Other: None

Musculoskeletal: No acute displaced fracture. No significant
degenerative changes.
IMPRESSION: CT demonstrates physiologic changes of the right adnexa, potentially
a ruptured cyst/follicle with trace free fluid in the right abdomen
and pelvis.

## 2020-03-08 IMAGING — US US ART/VEN ABD/PELV/SCROTUM DOPPLER LTD
1 series · 13 of 25 positions shown · non-contrast
Comparison: Abdominal and pelvic CT scan of today's date

CLINICAL DATA: Pelvic pain began at 5 a.m. today. It is now
subsided. Patient was recently pregnant.

EXAM:
TRANSABDOMINAL AND TRANSVAGINAL ULTRASOUND OF PELVIS
DOPPLER ULTRASOUND OF OVARIES
TECHNIQUE: Both transabdominal and transvaginal ultrasound examinations of the
pelvis were performed. Transabdominal technique was performed for
global imaging of the pelvis including uterus, ovaries, adnexal
regions, and pelvic cul-de-sac.
It was necessary to proceed with endovaginal exam following the
transabdominal exam to visualize the ovaries and adnexal regions..
Color and duplex Doppler ultrasound was utilized to evaluate blood
flow to the ovaries.

[Series 1: us art/ven abd/pelv/scrotum doppler ltd · 0.19mm/px · 13 of 157 slices shown]
[im 1/157]
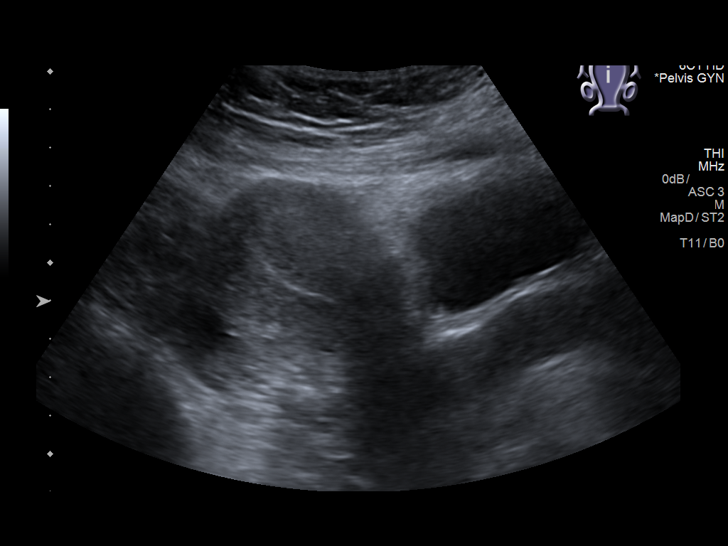
[im 14/157]
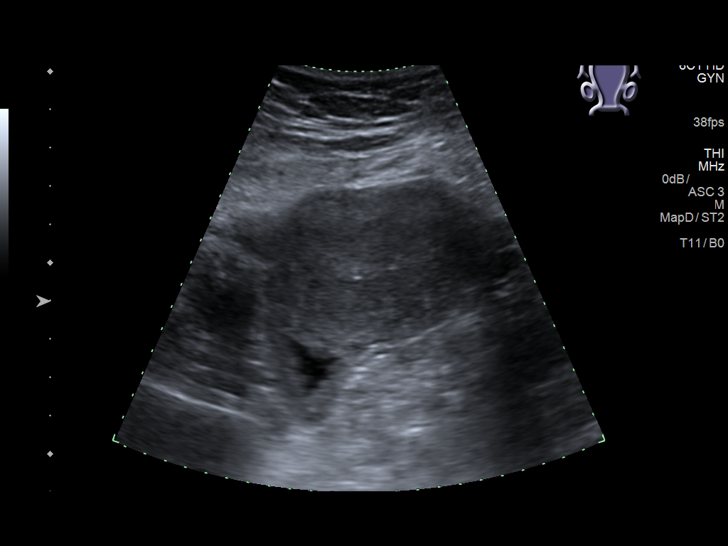
[im 27/157]
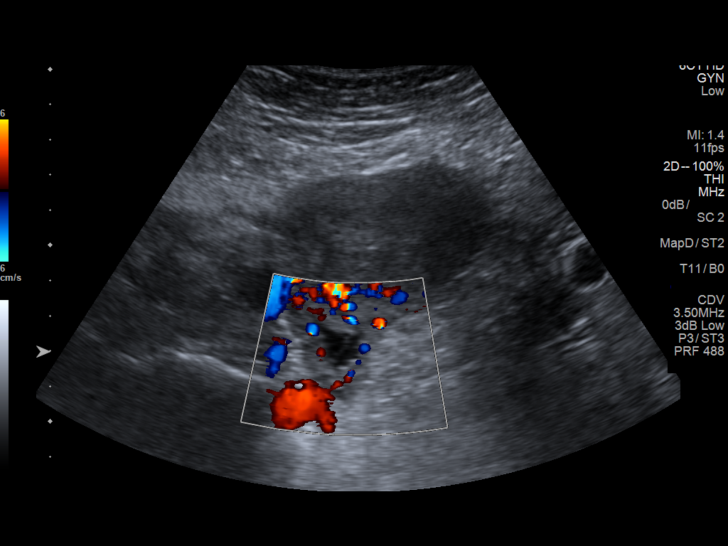
[im 40/157]
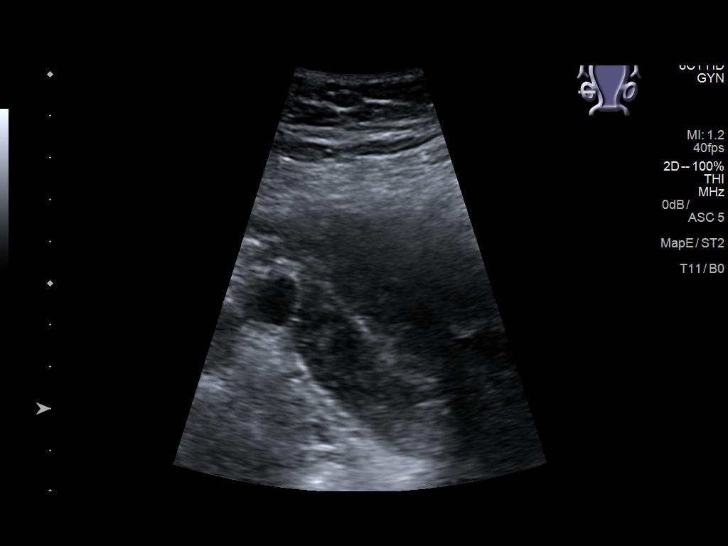
[im 53/157]
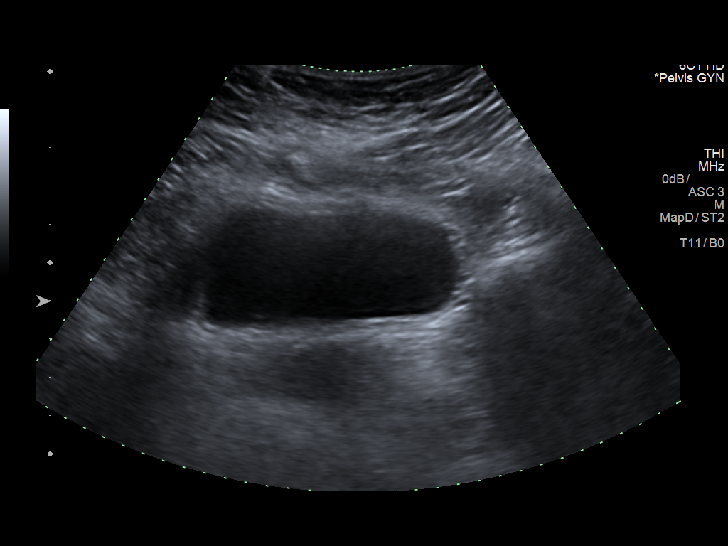
[im 66/157]
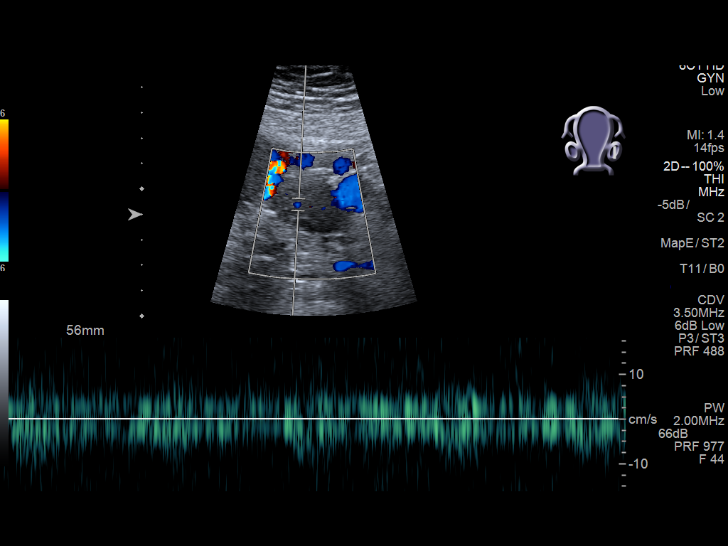
[im 79/157]
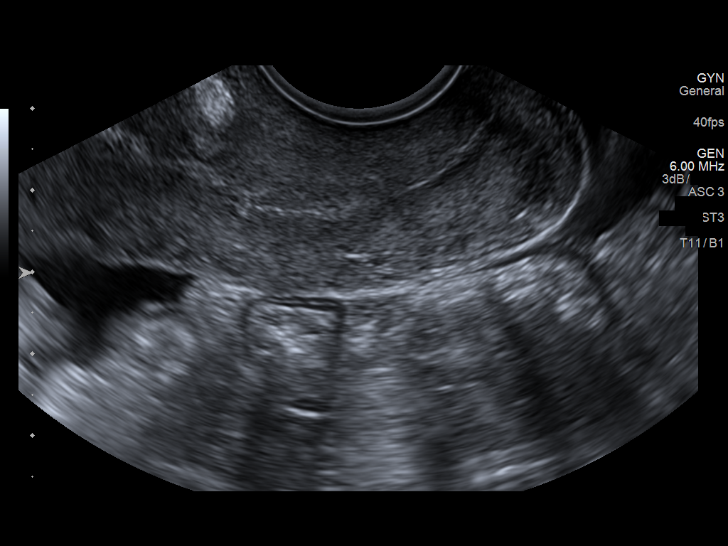
[im 92/157]
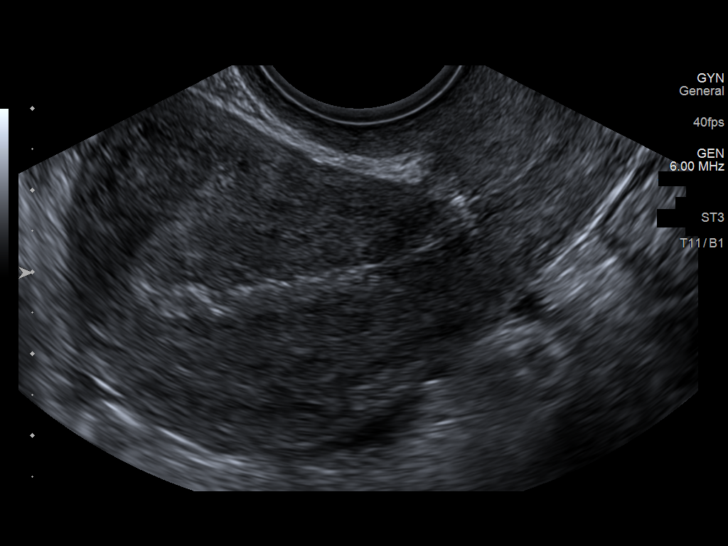
[im 105/157]
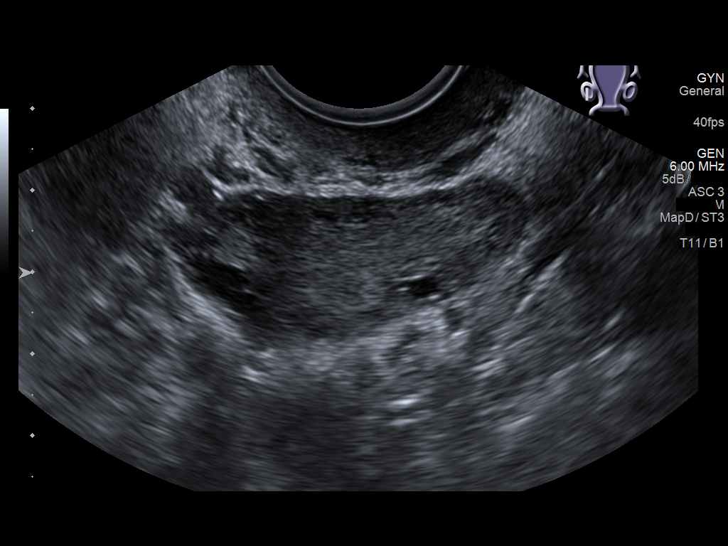
[im 118/157]
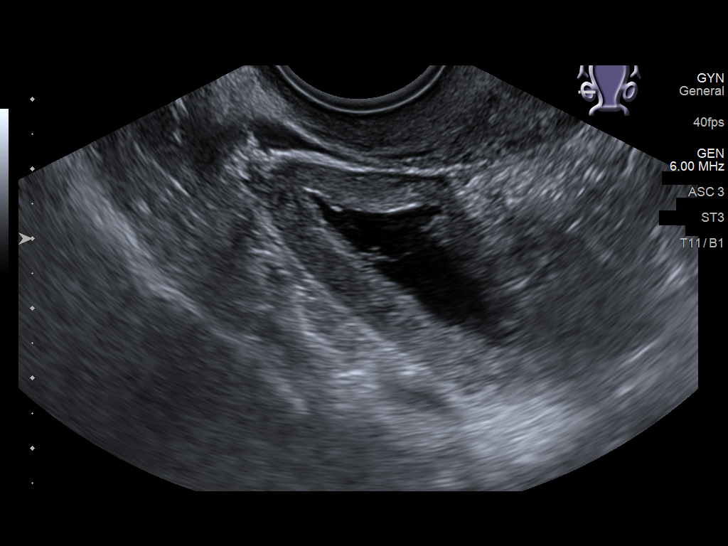
[im 131/157]
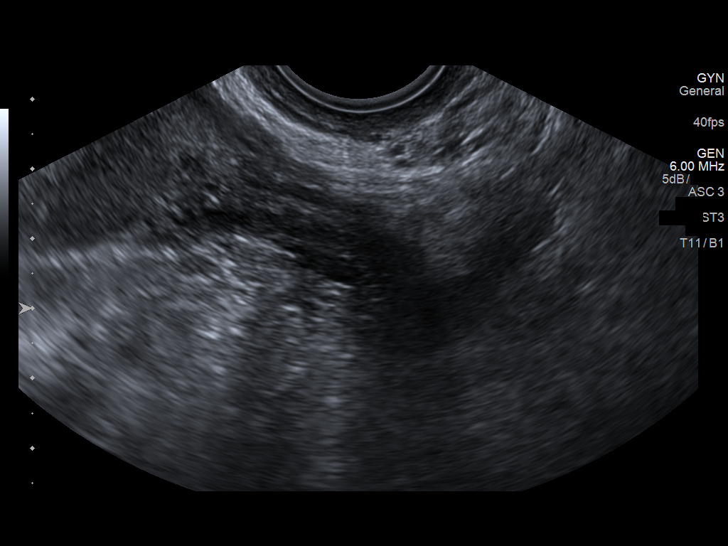
[im 144/157]
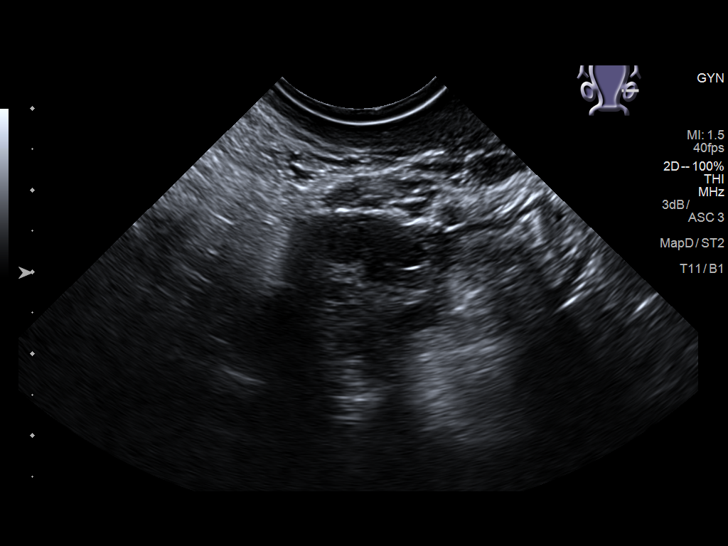
[im 157/157]
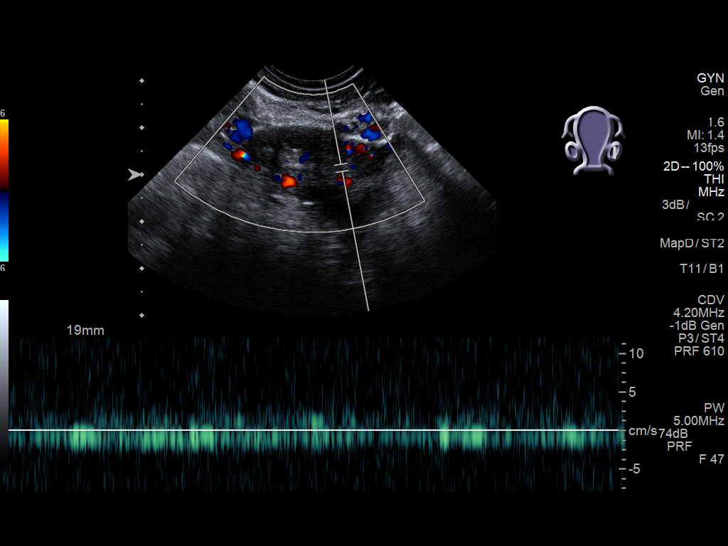

[13 of 25 positions shown; findings below may reference images not displayed]

FINDINGS: Uterus

Measurements: 7.7 x 4.2 x 5.6 cm.. No fibroids or other mass
visualized.

Endometrium

Thickness: 8.7 mm.. There is a trace of fluid within the endometrial
canal.

Right ovary

Measurements: 4.2 x 1.8 x 2.0 cm. There is a complex cystic
component adjacent to the right ovary with a small amount of
adjacent free fluid measuring 4.3 x 2.4 x 2.9 cm.

Left ovary

Measurements: 3.2 x 1.6 x 1.9 cm. Normal appearance/no adnexal mass.

Pulsed Doppler evaluation of both ovaries demonstrates normal
low-resistance arterial and venous waveforms.

Other findings:There is a small amount of free fluid in the
cul-de-sac with trace amounts about both ovaries.
IMPRESSION: Probable recently ruptured hemorrhagic right ovarian cyst. Small
amount of free pelvic fluid. No abnormal hypervascularity to suggest
an ectopic pregnancy. Normal vascular pattern of both ovaries.
Follow-up ultrasound in 8-12 weeks is recommended.

Normal appearing uterus and endometrium.

## 2020-03-11 DIAGNOSIS — O139 Gestational [pregnancy-induced] hypertension without significant proteinuria, unspecified trimester: Secondary | ICD-10-CM | POA: Diagnosis not present

## 2020-03-14 DIAGNOSIS — O133 Gestational [pregnancy-induced] hypertension without significant proteinuria, third trimester: Secondary | ICD-10-CM | POA: Diagnosis not present

## 2020-03-14 DIAGNOSIS — Z3A32 32 weeks gestation of pregnancy: Secondary | ICD-10-CM | POA: Diagnosis not present

## 2020-03-25 DIAGNOSIS — O99340 Other mental disorders complicating pregnancy, unspecified trimester: Secondary | ICD-10-CM | POA: Insufficient documentation

## 2020-03-25 DIAGNOSIS — E282 Polycystic ovarian syndrome: Secondary | ICD-10-CM | POA: Insufficient documentation

## 2020-04-04 DIAGNOSIS — O139 Gestational [pregnancy-induced] hypertension without significant proteinuria, unspecified trimester: Secondary | ICD-10-CM | POA: Diagnosis not present

## 2020-04-04 DIAGNOSIS — O365990 Maternal care for other known or suspected poor fetal growth, unspecified trimester, not applicable or unspecified: Secondary | ICD-10-CM | POA: Diagnosis not present

## 2020-04-04 DIAGNOSIS — Z3A35 35 weeks gestation of pregnancy: Secondary | ICD-10-CM | POA: Diagnosis not present

## 2020-04-04 DIAGNOSIS — O133 Gestational [pregnancy-induced] hypertension without significant proteinuria, third trimester: Secondary | ICD-10-CM | POA: Diagnosis not present

## 2020-04-07 ENCOUNTER — Encounter: Payer: Self-pay | Admitting: General Practice

## 2020-04-07 ENCOUNTER — Other Ambulatory Visit: Payer: Self-pay

## 2020-04-07 ENCOUNTER — Ambulatory Visit (INDEPENDENT_AMBULATORY_CARE_PROVIDER_SITE_OTHER): Payer: BC Managed Care – PPO | Admitting: *Deleted

## 2020-04-07 DIAGNOSIS — Z3A35 35 weeks gestation of pregnancy: Secondary | ICD-10-CM | POA: Diagnosis not present

## 2020-04-07 DIAGNOSIS — Z8759 Personal history of other complications of pregnancy, childbirth and the puerperium: Secondary | ICD-10-CM

## 2020-04-07 DIAGNOSIS — O133 Gestational [pregnancy-induced] hypertension without significant proteinuria, third trimester: Secondary | ICD-10-CM | POA: Diagnosis not present

## 2020-04-07 MED ORDER — BETAMETHASONE SOD PHOS & ACET 6 (3-3) MG/ML IJ SUSP
12.0000 mg | INTRAMUSCULAR | Status: AC
  Administered 2020-04-07 – 2020-04-08 (×2): 12 mg via INTRAMUSCULAR

## 2020-04-07 NOTE — Progress Notes (Signed)
Patient was assessed and managed by nursing staff during this encounter. I have reviewed the chart and agree with the documentation and plan. I have also made any necessary editorial changes.  Verita Schneiders, MD 04/07/2020 2:49 PM

## 2020-04-07 NOTE — Progress Notes (Signed)
Pt here from [facility] for 1st BMZ injection. Was sent over from office after seeing provider today.  Is G2P1 at 35.3wks. with elevated b/ps from her office. Understands to return tomorrow around 2p for 2nd injection. PT has order today with her for inj today and tomorrow

## 2020-04-08 ENCOUNTER — Ambulatory Visit (INDEPENDENT_AMBULATORY_CARE_PROVIDER_SITE_OTHER): Payer: BC Managed Care – PPO

## 2020-04-08 DIAGNOSIS — O139 Gestational [pregnancy-induced] hypertension without significant proteinuria, unspecified trimester: Secondary | ICD-10-CM | POA: Diagnosis not present

## 2020-04-08 DIAGNOSIS — Z8759 Personal history of other complications of pregnancy, childbirth and the puerperium: Secondary | ICD-10-CM | POA: Diagnosis not present

## 2020-04-08 NOTE — Progress Notes (Signed)
Pt here today for second dose of Betamethasone. Administered in LUQ.  Pt tolerated well.  Pt will return to her [facility] to continue prenatal care.    Mel Almond, RN  04/08/20

## 2020-04-08 NOTE — Progress Notes (Signed)
Patient was assessed and managed by nursing staff during this encounter. I have reviewed the chart and agree with the documentation and plan. I have also made any necessary editorial changes.  Verita Schneiders, MD 04/08/2020 3:22 PM

## 2020-04-11 DIAGNOSIS — Z3685 Encounter for antenatal screening for Streptococcus B: Secondary | ICD-10-CM | POA: Diagnosis not present

## 2020-04-11 DIAGNOSIS — O139 Gestational [pregnancy-induced] hypertension without significant proteinuria, unspecified trimester: Secondary | ICD-10-CM | POA: Diagnosis not present

## 2020-04-11 LAB — PROTEIN / CREATININE RATIO, URINE: Protein/Creat. Ratio: 344

## 2020-04-15 ENCOUNTER — Telehealth (HOSPITAL_COMMUNITY): Payer: Self-pay | Admitting: *Deleted

## 2020-04-15 ENCOUNTER — Encounter (HOSPITAL_COMMUNITY): Payer: Self-pay | Admitting: *Deleted

## 2020-04-15 DIAGNOSIS — O133 Gestational [pregnancy-induced] hypertension without significant proteinuria, third trimester: Secondary | ICD-10-CM | POA: Diagnosis not present

## 2020-04-15 DIAGNOSIS — Z3A36 36 weeks gestation of pregnancy: Secondary | ICD-10-CM | POA: Diagnosis not present

## 2020-04-15 NOTE — Patient Instructions (Signed)
Sharon Gardner  04/15/2020   Your procedure is scheduled on:  04/21/2020            Arrive at [place] at Entrance C on [address] at [facility]  and [address]. You are invited to use the FREE valet parking or use the Visitor's parking deck.  Pick up the phone at the desk and dial [phone].  Call this number if you have problems the morning of surgery: [phone]  Remember:   Do not eat food:(After Midnight) Desps de medianoche.  Do not drink clear liquids: (After Midnight) Desps de medianoche.  Take these medicines the morning of surgery with A SIP OF WATER:  none   Do not wear jewelry, make-up or nail polish.  Do not wear lotions, powders, or perfumes. Do not wear deodorant.  Do not shave 48 hours prior to surgery.  Do not bring valuables to the hospital.  [facility] is not   responsible for any belongings or valuables brought to the hospital.  Contacts, dentures or bridgework may not be worn into surgery.  Leave suitcase in the car. After surgery it may be brought to your room.  For patients admitted to the hospital, checkout time is 11:00 AM the day of              discharge.      Please read over the following fact sheets that you were given:     Preparing for Surgery

## 2020-04-15 NOTE — Telephone Encounter (Signed)
Preadmission screen  

## 2020-04-16 ENCOUNTER — Encounter (HOSPITAL_COMMUNITY): Payer: Self-pay

## 2020-04-18 DIAGNOSIS — Z3A37 37 weeks gestation of pregnancy: Secondary | ICD-10-CM | POA: Diagnosis not present

## 2020-04-18 DIAGNOSIS — O133 Gestational [pregnancy-induced] hypertension without significant proteinuria, third trimester: Secondary | ICD-10-CM | POA: Diagnosis not present

## 2020-04-18 NOTE — H&P (Signed)
Sharon Gardner   is a 29 y.o. female G3P1001 at 43 3/7 weeks (EDD 05/09/20 by 8 week [place] inconsistent with LMP) presents for scheduled repeat c-section for gestational hypertension.   Pt had elevated BP to 140-150/80-90 since about 35 weeks, but no preeclampsia symptoms and labs remain WNL with just slight increase in protein:creatinine ratio of 344mg .  She has been followed with NST's that are WNL and had a normal growth [place] 04/04/20 with EFW 85% AFI 15,  Cephalic. Prenatal care otherwise significan tfor PIH with her last pregnancy and she has been on baby ASA.  She had a LTCS for arrest of dilation with her first pregnancy and declines TOL.  SHe also has PCOS and required metformin to conceive but d/c after the first trimester,  OB History    Gravida  3   Para  1   Term  1   Preterm      AB  1   Living  1     SAB  1   TAB      Ectopic      Multiple  0   Live Births  1         02-11-2017, 37.4 wks  F, 7lbs 1oz, Cesarean Section SAB x 1  Past Medical History:  Diagnosis Date  . Allergy   . Medical history non-contributory   . Pregnancy induced hypertension    Past Surgical History:  Procedure Laterality Date  . CESAREAN SECTION N/A 02/11/2017   Procedure: CESAREAN SECTION;  Surgeon: Paula Compton, MD;  Location: [place];  Service: Obstetrics;  Laterality: N/A;  . TONSILLECTOMY    . WISDOM TOOTH EXTRACTION     Family History: family history includes Cancer in her mother and paternal grandmother; Miscarriages / [place] in her paternal grandmother. Social History:  reports that she has never smoked. She has never used smokeless tobacco. She reports that she does not drink alcohol and does not use drugs.     Maternal Diabetes: No Genetic Screening: Normal Maternal Ultrasounds/Referrals: Normal Fetal Ultrasounds or other Referrals:  None Maternal Substance Abuse:  No Significant Maternal Medications:  Meds include: Other:   Baby ASA Significant  Maternal Lab Results:  Group B Strep negative Other Comments:  None  Review of Systems  Gastrointestinal: Negative for abdominal pain.  Genitourinary: Negative for pelvic pain.  Neurological: Negative for headaches.   Maternal Medical History:  Contractions: Frequency: irregular.   Perceived severity is mild.    Fetal activity: Perceived fetal activity is normal.    Prenatal complications: PIH, prior c-section  Prenatal Complications - Diabetes: none.      unknown if currently breastfeeding. Maternal Exam:  Uterine Assessment: Contraction strength is mild.  Contraction frequency is irregular.   Abdomen: Patient reports no abdominal tenderness. Fetal presentation: vertex  Introitus: Normal vulva. Normal vagina.    Physical Exam Cardiovascular:     Rate and Rhythm: Normal rate and regular rhythm.  Pulmonary:     Effort: Pulmonary effort is normal.  Abdominal:     Palpations: Abdomen is soft.  Genitourinary:    General: Normal vulva.  Neurological:     Mental Status: She is alert.  Psychiatric:        Mood and Affect: Mood normal.     Prenatal labs: ABO, Rh: AB/Positive/-- (01/11 0000) Antibody: Negative (01/11 0000) Rubella: Immune (01/11 0000) RPR: Nonreactive (01/11 0000)  HBsAg: Negative (01/11 0000)  HIV: Non-reactive (01/11 0000)  GBS:   Negative Panorama  low risk Essential panel negative 2017 One hour GCT 100   Assessment/Plan:  d/w pt c-section in detail. Risks/benefits of bleeding infection and possible damage to bowel and bladder reviewed.  Pt is ready to proceed.   Logan Bores 04/18/2020, 4:22 PM

## 2020-04-19 ENCOUNTER — Other Ambulatory Visit: Payer: Self-pay

## 2020-04-19 ENCOUNTER — Other Ambulatory Visit (HOSPITAL_COMMUNITY)
Admission: RE | Admit: 2020-04-19 | Discharge: 2020-04-19 | Disposition: A | Discharge status: Home / Self Care | Payer: BC Managed Care – PPO | Source: Ambulatory Visit | Attending: Obstetrics and Gynecology | Admitting: Obstetrics and Gynecology

## 2020-04-19 DIAGNOSIS — Z23 Encounter for immunization: Secondary | ICD-10-CM | POA: Diagnosis not present

## 2020-04-19 DIAGNOSIS — O134 Gestational [pregnancy-induced] hypertension without significant proteinuria, complicating childbirth: Secondary | ICD-10-CM | POA: Diagnosis not present

## 2020-04-19 DIAGNOSIS — Z880 Allergy status to penicillin: Secondary | ICD-10-CM | POA: Diagnosis not present

## 2020-04-19 DIAGNOSIS — Z3A37 37 weeks gestation of pregnancy: Secondary | ICD-10-CM | POA: Diagnosis not present

## 2020-04-19 DIAGNOSIS — O99284 Endocrine, nutritional and metabolic diseases complicating childbirth: Secondary | ICD-10-CM | POA: Diagnosis not present

## 2020-04-19 DIAGNOSIS — O34211 Maternal care for low transverse scar from previous cesarean delivery: Secondary | ICD-10-CM | POA: Diagnosis not present

## 2020-04-19 DIAGNOSIS — Z20822 Contact with and (suspected) exposure to covid-19: Secondary | ICD-10-CM | POA: Insufficient documentation

## 2020-04-19 DIAGNOSIS — O133 Gestational [pregnancy-induced] hypertension without significant proteinuria, third trimester: Secondary | ICD-10-CM | POA: Diagnosis not present

## 2020-04-19 DIAGNOSIS — Z98891 History of uterine scar from previous surgery: Secondary | ICD-10-CM | POA: Diagnosis not present

## 2020-04-19 DIAGNOSIS — Z3A00 Weeks of gestation of pregnancy not specified: Secondary | ICD-10-CM | POA: Diagnosis not present

## 2020-04-19 DIAGNOSIS — E282 Polycystic ovarian syndrome: Secondary | ICD-10-CM | POA: Diagnosis not present

## 2020-04-19 LAB — TYPE AND SCREEN
ABO/RH(D): AB POS
Antibody Screen: NEGATIVE

## 2020-04-19 LAB — CBC
HCT: 33.3 % — ABNORMAL LOW (ref 36.0–46.0)
Hemoglobin: 11.5 g/dL — ABNORMAL LOW (ref 12.0–15.0)
MCH: 30.1 pg (ref 26.0–34.0)
MCHC: 34.5 g/dL (ref 30.0–36.0)
MCV: 87.2 fL (ref 80.0–100.0)
Platelets: 162 10*3/uL (ref 150–400)
RBC: 3.82 MIL/uL — ABNORMAL LOW (ref 3.87–5.11)
RDW: 13 % (ref 11.5–15.5)
WBC: 9.1 10*3/uL (ref 4.0–10.5)
nRBC: 0 % (ref 0.0–0.2)

## 2020-04-19 LAB — COMPREHENSIVE METABOLIC PANEL
ALT: 9 U/L (ref 0–44)
AST: 17 U/L (ref 15–41)
Albumin: 2.8 g/dL — ABNORMAL LOW (ref 3.5–5.0)
Alkaline Phosphatase: 89 U/L (ref 38–126)
Anion gap: 10 (ref 5–15)
BUN: <5 mg/dL — ABNORMAL LOW (ref 6–20)
CO2: 21 mmol/L — ABNORMAL LOW (ref 22–32)
Calcium: 9 mg/dL (ref 8.9–10.3)
Chloride: 106 mmol/L (ref 98–111)
Creatinine, Ser: 0.63 mg/dL (ref 0.44–1.00)
GFR calc Af Amer: >60 mL/min (ref >60)
GFR calc non Af Amer: >60 mL/min (ref >60)
Glucose, Bld: 85 mg/dL (ref 70–99)
Potassium: 3.3 mmol/L — ABNORMAL LOW (ref 3.5–5.1)
Sodium: 137 mmol/L (ref 135–145)
Total Bilirubin: 0.5 mg/dL (ref 0.3–1.2)
Total Protein: 6.1 g/dL — ABNORMAL LOW (ref 6.5–8.1)

## 2020-04-19 LAB — SARS CORONAVIRUS 2 (TAT 6-24 HRS): SARS Coronavirus 2: NEGATIVE

## 2020-04-19 LAB — RPR: RPR Ser Ql: NONREACTIVE

## 2020-04-19 NOTE — MAU Note (Signed)
Pt here for swab and lab draw. Denies symptoms or sick contacts. Swab collected 

## 2020-04-21 ENCOUNTER — Encounter (HOSPITAL_COMMUNITY)
Admission: RE | Disposition: A | Discharge status: Home / Self Care | Payer: Self-pay | Source: Home / Self Care | Attending: Obstetrics and Gynecology

## 2020-04-21 ENCOUNTER — Inpatient Hospital Stay (HOSPITAL_COMMUNITY)
Admission: RE | Admit: 2020-04-21 | Discharge: 2020-04-23 | DRG: 788 | Disposition: A | Discharge status: Home / Self Care | Payer: BC Managed Care – PPO | Attending: Obstetrics and Gynecology | Admitting: Obstetrics and Gynecology

## 2020-04-21 ENCOUNTER — Inpatient Hospital Stay (HOSPITAL_COMMUNITY): Payer: BC Managed Care – PPO | Admitting: Anesthesiology

## 2020-04-21 ENCOUNTER — Encounter (HOSPITAL_COMMUNITY): Payer: Self-pay | Admitting: Obstetrics and Gynecology

## 2020-04-21 ENCOUNTER — Other Ambulatory Visit: Payer: Self-pay

## 2020-04-21 DIAGNOSIS — O134 Gestational [pregnancy-induced] hypertension without significant proteinuria, complicating childbirth: Secondary | ICD-10-CM | POA: Diagnosis present

## 2020-04-21 DIAGNOSIS — O139 Gestational [pregnancy-induced] hypertension without significant proteinuria, unspecified trimester: Secondary | ICD-10-CM | POA: Diagnosis present

## 2020-04-21 DIAGNOSIS — O99284 Endocrine, nutritional and metabolic diseases complicating childbirth: Secondary | ICD-10-CM | POA: Diagnosis present

## 2020-04-21 DIAGNOSIS — O1413 Severe pre-eclampsia, third trimester: Secondary | ICD-10-CM | POA: Diagnosis present

## 2020-04-21 DIAGNOSIS — O34211 Maternal care for low transverse scar from previous cesarean delivery: Principal | ICD-10-CM | POA: Diagnosis present

## 2020-04-21 DIAGNOSIS — Z880 Allergy status to penicillin: Secondary | ICD-10-CM

## 2020-04-21 DIAGNOSIS — E282 Polycystic ovarian syndrome: Secondary | ICD-10-CM | POA: Diagnosis present

## 2020-04-21 DIAGNOSIS — Z98891 History of uterine scar from previous surgery: Secondary | ICD-10-CM

## 2020-04-21 DIAGNOSIS — O133 Gestational [pregnancy-induced] hypertension without significant proteinuria, third trimester: Secondary | ICD-10-CM

## 2020-04-21 DIAGNOSIS — Z3A37 37 weeks gestation of pregnancy: Secondary | ICD-10-CM

## 2020-04-21 DIAGNOSIS — Z20822 Contact with and (suspected) exposure to covid-19: Secondary | ICD-10-CM | POA: Diagnosis present

## 2020-04-21 DIAGNOSIS — O1400 Mild to moderate pre-eclampsia, unspecified trimester: Secondary | ICD-10-CM | POA: Diagnosis present

## 2020-04-21 HISTORY — DX: Gestational (pregnancy-induced) hypertension without significant proteinuria, third trimester: O13.3

## 2020-04-21 LAB — PROTEIN / CREATININE RATIO, URINE
Creatinine, Urine: 41.54 mg/dL
Protein Creatinine Ratio: 0.26 mg/mg{Cre} — ABNORMAL HIGH (ref 0.00–0.15)
Total Protein, Urine: 11 mg/dL

## 2020-04-21 SURGERY — Surgical Case
Anesthesia: Spinal

## 2020-04-21 MED ORDER — BUPIVACAINE IN DEXTROSE 0.75-8.25 % IT SOLN
INTRATHECAL | Status: DC | PRN
  Administered 2020-04-21: 1.6 mL via INTRATHECAL

## 2020-04-21 MED ORDER — SODIUM CHLORIDE 0.9% FLUSH: 3.0000 mL | INTRAVENOUS | Status: DC | PRN

## 2020-04-21 MED ORDER — MEPERIDINE HCL 25 MG/ML IJ SOLN: 6.2500 mg | INTRAMUSCULAR | Status: DC | PRN

## 2020-04-21 MED ORDER — ONDANSETRON HCL 4 MG/2ML IJ SOLN
INTRAMUSCULAR | Status: AC
  Filled 2020-04-21: qty 2

## 2020-04-21 MED ORDER — TETANUS-DIPHTH-ACELL PERTUSSIS 5-2.5-18.5 LF-MCG/0.5 IM SUSP: 0.5000 mL | Freq: Once | INTRAMUSCULAR | Status: DC

## 2020-04-21 MED ORDER — NALOXONE HCL 0.4 MG/ML IJ SOLN: 0.4000 mg | INTRAMUSCULAR | Status: DC | PRN

## 2020-04-21 MED ORDER — ONDANSETRON HCL 4 MG/2ML IJ SOLN
INTRAMUSCULAR | Status: DC | PRN
  Administered 2020-04-21: 4 mg via INTRAVENOUS

## 2020-04-21 MED ORDER — PHENYLEPHRINE HCL-NACL 20-0.9 MG/250ML-% IV SOLN
INTRAVENOUS | Status: AC
  Filled 2020-04-21: qty 250

## 2020-04-21 MED ORDER — COCONUT OIL OIL: 1.0000 "application " | TOPICAL_OIL | Status: DC | PRN

## 2020-04-21 MED ORDER — PROMETHAZINE HCL 25 MG/ML IJ SOLN: 6.2500 mg | INTRAMUSCULAR | Status: DC | PRN

## 2020-04-21 MED ORDER — LACTATED RINGERS IV SOLN: INTRAVENOUS | Status: DC

## 2020-04-21 MED ORDER — STERILE WATER FOR IRRIGATION IR SOLN
Status: DC | PRN
  Administered 2020-04-21: 1000 mL

## 2020-04-21 MED ORDER — ACETAMINOPHEN 500 MG PO TABS
1000.0000 mg | ORAL_TABLET | Freq: Four times a day (QID) | ORAL | Status: DC
  Administered 2020-04-21 – 2020-04-23 (×9): 1000 mg via ORAL
  Filled 2020-04-21 (×9): qty 2

## 2020-04-21 MED ORDER — FENTANYL CITRATE (PF) 100 MCG/2ML IJ SOLN
INTRAMUSCULAR | Status: AC
  Filled 2020-04-21: qty 2

## 2020-04-21 MED ORDER — SIMETHICONE 80 MG PO CHEW
80.0000 mg | CHEWABLE_TABLET | Freq: Three times a day (TID) | ORAL | Status: DC
  Administered 2020-04-21 – 2020-04-23 (×6): 80 mg via ORAL
  Filled 2020-04-21 (×7): qty 1

## 2020-04-21 MED ORDER — NALBUPHINE HCL 10 MG/ML IJ SOLN: 5.0000 mg | INTRAMUSCULAR | Status: DC | PRN

## 2020-04-21 MED ORDER — DIPHENHYDRAMINE HCL 25 MG PO CAPS: 25.0000 mg | ORAL_CAPSULE | ORAL | Status: DC | PRN

## 2020-04-21 MED ORDER — OXYCODONE HCL 5 MG PO TABS: 5.0000 mg | ORAL_TABLET | Freq: Once | ORAL | Status: DC | PRN

## 2020-04-21 MED ORDER — ONDANSETRON HCL 4 MG/2ML IJ SOLN: 4.0000 mg | Freq: Three times a day (TID) | INTRAMUSCULAR | Status: DC | PRN

## 2020-04-21 MED ORDER — CLINDAMYCIN PHOSPHATE 900 MG/50ML IV SOLN
900.0000 mg | INTRAVENOUS | Status: AC
  Administered 2020-04-21: 900 mg via INTRAVENOUS

## 2020-04-21 MED ORDER — DIPHENHYDRAMINE HCL 25 MG PO CAPS: 25.0000 mg | ORAL_CAPSULE | Freq: Four times a day (QID) | ORAL | Status: DC | PRN

## 2020-04-21 MED ORDER — SCOPOLAMINE 1 MG/3DAYS TD PT72
MEDICATED_PATCH | TRANSDERMAL | Status: AC
  Filled 2020-04-21: qty 1

## 2020-04-21 MED ORDER — OXYCODONE HCL 5 MG PO TABS: 5.0000 mg | ORAL_TABLET | ORAL | Status: DC | PRN

## 2020-04-21 MED ORDER — LACTATED RINGERS IV SOLN: INTRAVENOUS | Status: DC | PRN

## 2020-04-21 MED ORDER — SIMETHICONE 80 MG PO CHEW
80.0000 mg | CHEWABLE_TABLET | ORAL | Status: DC
  Administered 2020-04-21 – 2020-04-22 (×2): 80 mg via ORAL
  Filled 2020-04-21 (×2): qty 1

## 2020-04-21 MED ORDER — IBUPROFEN 800 MG PO TABS
800.0000 mg | ORAL_TABLET | Freq: Three times a day (TID) | ORAL | Status: DC
  Administered 2020-04-21 – 2020-04-23 (×7): 800 mg via ORAL
  Filled 2020-04-21 (×7): qty 1

## 2020-04-21 MED ORDER — NALBUPHINE HCL 10 MG/ML IJ SOLN: 5.0000 mg | Freq: Once | INTRAMUSCULAR | Status: DC | PRN

## 2020-04-21 MED ORDER — KETOROLAC TROMETHAMINE 30 MG/ML IJ SOLN
30.0000 mg | Freq: Once | INTRAMUSCULAR | Status: AC | PRN
  Administered 2020-04-21: 30 mg via INTRAVENOUS

## 2020-04-21 MED ORDER — HYDROMORPHONE HCL 1 MG/ML IJ SOLN: 0.2500 mg | INTRAMUSCULAR | Status: DC | PRN

## 2020-04-21 MED ORDER — KETOROLAC TROMETHAMINE 30 MG/ML IJ SOLN
INTRAMUSCULAR | Status: AC
  Filled 2020-04-21: qty 1

## 2020-04-21 MED ORDER — SENNOSIDES-DOCUSATE SODIUM 8.6-50 MG PO TABS
2.0000 | ORAL_TABLET | ORAL | Status: DC
  Administered 2020-04-21 – 2020-04-22 (×2): 2 via ORAL
  Filled 2020-04-21 (×2): qty 2

## 2020-04-21 MED ORDER — SCOPOLAMINE 1 MG/3DAYS TD PT72
1.0000 | MEDICATED_PATCH | Freq: Once | TRANSDERMAL | Status: DC
  Administered 2020-04-21: 1.5 mg via TRANSDERMAL

## 2020-04-21 MED ORDER — SODIUM CHLORIDE 0.9 % IR SOLN
Status: DC | PRN
  Administered 2020-04-21: 1000 mL

## 2020-04-21 MED ORDER — FENTANYL CITRATE (PF) 100 MCG/2ML IJ SOLN
INTRAMUSCULAR | Status: DC | PRN
  Administered 2020-04-21: 15 ug via INTRATHECAL

## 2020-04-21 MED ORDER — SODIUM CHLORIDE 0.9 % IV SOLN: INTRAVENOUS | Status: DC | PRN

## 2020-04-21 MED ORDER — GENTAMICIN SULFATE 40 MG/ML IJ SOLN: 5.0000 mg/kg | INTRAVENOUS | Status: DC

## 2020-04-21 MED ORDER — CLINDAMYCIN PHOSPHATE 900 MG/50ML IV SOLN: 900.0000 mg | INTRAVENOUS | Status: DC

## 2020-04-21 MED ORDER — DIPHENHYDRAMINE HCL 50 MG/ML IJ SOLN: 12.5000 mg | INTRAMUSCULAR | Status: DC | PRN

## 2020-04-21 MED ORDER — MORPHINE SULFATE (PF) 0.5 MG/ML IJ SOLN
INTRAMUSCULAR | Status: DC | PRN
  Administered 2020-04-21: 150 ug via INTRATHECAL

## 2020-04-21 MED ORDER — PHENYLEPHRINE HCL-NACL 20-0.9 MG/250ML-% IV SOLN
INTRAVENOUS | Status: DC | PRN
  Administered 2020-04-21: 60 ug/min via INTRAVENOUS

## 2020-04-21 MED ORDER — DEXAMETHASONE SODIUM PHOSPHATE 4 MG/ML IJ SOLN
INTRAMUSCULAR | Status: AC
  Filled 2020-04-21: qty 1

## 2020-04-21 MED ORDER — OXYTOCIN-SODIUM CHLORIDE 30-0.9 UT/500ML-% IV SOLN: 2.5000 [IU]/h | INTRAVENOUS | Status: AC

## 2020-04-21 MED ORDER — SIMETHICONE 80 MG PO CHEW: 80.0000 mg | CHEWABLE_TABLET | ORAL | Status: DC | PRN

## 2020-04-21 MED ORDER — POVIDONE-IODINE 10 % EX SWAB
2.0000 "application " | Freq: Once | CUTANEOUS | Status: AC
  Administered 2020-04-21: 2 via TOPICAL

## 2020-04-21 MED ORDER — OXYTOCIN-SODIUM CHLORIDE 30-0.9 UT/500ML-% IV SOLN
INTRAVENOUS | Status: DC | PRN
  Administered 2020-04-21: 250 mL via INTRAVENOUS

## 2020-04-21 MED ORDER — ZOLPIDEM TARTRATE 5 MG PO TABS: 5.0000 mg | ORAL_TABLET | Freq: Every evening | ORAL | Status: DC | PRN

## 2020-04-21 MED ORDER — DEXAMETHASONE SODIUM PHOSPHATE 4 MG/ML IJ SOLN
INTRAMUSCULAR | Status: DC | PRN
  Administered 2020-04-21: 4 mg via INTRAVENOUS

## 2020-04-21 MED ORDER — WITCH HAZEL-GLYCERIN EX PADS: 1.0000 "application " | MEDICATED_PAD | CUTANEOUS | Status: DC | PRN

## 2020-04-21 MED ORDER — CLINDAMYCIN PHOSPHATE 900 MG/50ML IV SOLN
INTRAVENOUS | Status: AC
  Filled 2020-04-21: qty 50

## 2020-04-21 MED ORDER — MORPHINE SULFATE (PF) 0.5 MG/ML IJ SOLN
INTRAMUSCULAR | Status: AC
  Filled 2020-04-21: qty 10

## 2020-04-21 MED ORDER — GENTAMICIN SULFATE 40 MG/ML IJ SOLN
5.0000 mg/kg | INTRAVENOUS | Status: AC
  Administered 2020-04-21: 310 mg via INTRAVENOUS
  Filled 2020-04-21: qty 7.75

## 2020-04-21 MED ORDER — MENTHOL 3 MG MT LOZG: 1.0000 | LOZENGE | OROMUCOSAL | Status: DC | PRN

## 2020-04-21 MED ORDER — DIBUCAINE (PERIANAL) 1 % EX OINT: 1.0000 "application " | TOPICAL_OINTMENT | CUTANEOUS | Status: DC | PRN

## 2020-04-21 MED ORDER — PRENATAL MULTIVITAMIN CH
1.0000 | ORAL_TABLET | Freq: Every day | ORAL | Status: DC
  Administered 2020-04-21 – 2020-04-23 (×3): 1 via ORAL
  Filled 2020-04-21 (×3): qty 1

## 2020-04-21 MED ORDER — POVIDONE-IODINE 10 % EX SWAB: 2.0000 "application " | Freq: Once | CUTANEOUS | Status: DC

## 2020-04-21 MED ORDER — NALOXONE HCL 4 MG/10ML IJ SOLN
1.0000 ug/kg/h | INTRAVENOUS | Status: DC | PRN
  Filled 2020-04-21: qty 5

## 2020-04-21 MED ORDER — OXYCODONE HCL 5 MG/5ML PO SOLN: 5.0000 mg | Freq: Once | ORAL | Status: DC | PRN

## 2020-04-21 SURGICAL SUPPLY — 35 items
BENZOIN TINCTURE PRP APPL 2/3 (GAUZE/BANDAGES/DRESSINGS) ×3 IMPLANT
CHLORAPREP W/TINT 26ML (MISCELLANEOUS) ×3 IMPLANT
CLAMP CORD UMBIL (MISCELLANEOUS) IMPLANT
CLOSURE WOUND 1/2 X4 (GAUZE/BANDAGES/DRESSINGS) ×1
CLOTH BEACON ORANGE TIMEOUT ST (SAFETY) ×3 IMPLANT
DRSG OPSITE POSTOP 4X10 (GAUZE/BANDAGES/DRESSINGS) ×3 IMPLANT
ELECT REM PT RETURN 9FT ADLT (ELECTROSURGICAL) ×3
ELECTRODE REM PT RTRN 9FT ADLT (ELECTROSURGICAL) ×1 IMPLANT
EXTRACTOR VACUUM KIWI (MISCELLANEOUS) IMPLANT
GLOVE BIO SURGEON STRL SZ 6.5 (GLOVE) ×2 IMPLANT
GLOVE BIO SURGEONS STRL SZ 6.5 (GLOVE) ×1
GLOVE BIOGEL PI IND STRL 7.0 (GLOVE) ×1 IMPLANT
GLOVE BIOGEL PI INDICATOR 7.0 (GLOVE) ×2
GOWN STRL REUS W/TWL LRG LVL3 (GOWN DISPOSABLE) ×6 IMPLANT
KIT ABG SYR 3ML LUER SLIP (SYRINGE) IMPLANT
NEEDLE HYPO 25X5/8 SAFETYGLIDE (NEEDLE) IMPLANT
NS IRRIG 1000ML POUR BTL (IV SOLUTION) ×3 IMPLANT
PACK C SECTION WH (CUSTOM PROCEDURE TRAY) ×3 IMPLANT
PAD OB MATERNITY 4.3X12.25 (PERSONAL CARE ITEMS) ×3 IMPLANT
PENCIL SMOKE EVAC W/HOLSTER (ELECTROSURGICAL) ×3 IMPLANT
RTRCTR C-SECT PINK 25CM LRG (MISCELLANEOUS) ×3 IMPLANT
STRIP CLOSURE SKIN 1/2X4 (GAUZE/BANDAGES/DRESSINGS) ×2 IMPLANT
SUT CHROMIC 1 CTX 36 (SUTURE) ×6 IMPLANT
SUT PLAIN 0 NONE (SUTURE) IMPLANT
SUT PLAIN 2 0 XLH (SUTURE) ×3 IMPLANT
SUT VIC AB 0 CT1 27 (SUTURE) ×4
SUT VIC AB 0 CT1 27XBRD ANBCTR (SUTURE) ×2 IMPLANT
SUT VIC AB 2-0 CT1 27 (SUTURE) ×2
SUT VIC AB 2-0 CT1 TAPERPNT 27 (SUTURE) ×1 IMPLANT
SUT VIC AB 3-0 CT1 27 (SUTURE)
SUT VIC AB 3-0 CT1 TAPERPNT 27 (SUTURE) IMPLANT
SUT VIC AB 4-0 KS 27 (SUTURE) ×3 IMPLANT
TOWEL OR 17X24 6PK STRL BLUE (TOWEL DISPOSABLE) ×3 IMPLANT
TRAY FOLEY W/BAG SLVR 14FR LF (SET/KITS/TRAYS/PACK) ×3 IMPLANT
WATER STERILE IRR 1000ML POUR (IV SOLUTION) ×3 IMPLANT

## 2020-04-21 NOTE — Anesthesia Procedure Notes (Signed)
Spinal  Patient location during procedure: OB Start time: 04/21/2020 7:25 AM End time: 04/21/2020 7:30 AM Staffing Performed: anesthesiologist  Anesthesiologist: Lynda Rainwater, MD Preanesthetic Checklist Completed: patient identified, IV checked, risks and benefits discussed, surgical consent, monitors and equipment checked, pre-op evaluation and timeout performed Spinal Block Patient position: sitting Prep: DuraPrep and site prepped and draped Patient monitoring: heart rate, cardiac monitor, continuous pulse ox and blood pressure Approach: midline Location: L3-4 Injection technique: single-shot Needle Needle type: Pencan  Needle gauge: 24 G Needle length: 10 cm Assessment Sensory level: T4

## 2020-04-21 NOTE — Transfer of Care (Signed)
Immediate Anesthesia Transfer of Care Note  Patient: Sharon Gardner  Procedure(s) Performed: CESAREAN SECTION (N/A )  Patient Location: PACU  Anesthesia Type:Spinal  Level of Consciousness: awake  Airway & Oxygen Therapy: Patient Spontanous Breathing  Post-op Assessment: Report given to RN and Post -op Vital signs reviewed and stable  Post vital signs: Reviewed and stable  Last Vitals:  Vitals Value Taken Time  BP 136/92 04/21/20 0850  Temp    Pulse 85 04/21/20 0855  Resp 20 04/21/20 0855  SpO2 98 % 04/21/20 0855  Vitals shown include unvalidated device data.  Last Pain:  Vitals:   04/21/20 0850  TempSrc: (P) Oral         Complications: No complications documented.

## 2020-04-21 NOTE — Anesthesia Preprocedure Evaluation (Signed)
Anesthesia Evaluation  Patient identified by MRN, date of birth, ID band Patient awake    Reviewed: Allergy & Precautions, H&P , NPO status , Patient's Chart, lab work & pertinent test results  Airway Mallampati: II  TM Distance: >3 FB Neck ROM: full    Dental no notable dental hx.    Pulmonary neg pulmonary ROS,    Pulmonary exam normal breath sounds clear to auscultation       Cardiovascular hypertension, Normal cardiovascular exam Rhythm:regular Rate:Normal     Neuro/Psych    GI/Hepatic   Endo/Other    Renal/GU      Musculoskeletal   Abdominal   Peds  Hematology   Anesthesia Other Findings   Reproductive/Obstetrics (+) Pregnancy                             Anesthesia Physical  Anesthesia Plan  ASA: II  Anesthesia Plan: Spinal   Post-op Pain Management:    Induction:   PONV Risk Score and Plan: 2 and Ondansetron, Midazolam and Treatment may vary due to age or medical condition  Airway Management Planned: Natural Airway  Additional Equipment:   Intra-op Plan:   Post-operative Plan:   Informed Consent: I have reviewed the patients History and Physical, chart, labs and discussed the procedure including the risks, benefits and alternatives for the proposed anesthesia with the patient or authorized representative who has indicated his/her understanding and acceptance.       Plan Discussed with: CRNA, Anesthesiologist and Surgeon  Anesthesia Plan Comments:         Anesthesia Quick Evaluation

## 2020-04-21 NOTE — Progress Notes (Signed)
Patient ID: Sharon Gardner, female   DOB: 1990-10-31, 29 y.o.   MRN: [id number] Pt states no PIH sx and no changes in dictated H&P.  Brief exam WNL.  140/88 BP this AM.  PIH labs WNL.

## 2020-04-21 NOTE — Anesthesia Postprocedure Evaluation (Signed)
Anesthesia Post Note  Patient: Sharon Gardner  Procedure(s) Performed: CESAREAN SECTION (N/A )     Patient location during evaluation: PACU Anesthesia Type: Spinal Level of consciousness: awake and alert Pain management: pain level controlled Vital Signs Assessment: post-procedure vital signs reviewed and stable Respiratory status: spontaneous breathing, nonlabored ventilation and respiratory function stable Cardiovascular status: blood pressure returned to baseline and stable Postop Assessment: no apparent nausea or vomiting Anesthetic complications: no   No complications documented.  Last Vitals:  Vitals:   04/21/20 0945 04/21/20 0959  BP: (!) 132/91 126/83  Pulse: 75 59  Resp: 18 18  Temp:    SpO2: 98% 96%    Last Pain:  Vitals:   04/21/20 0850  TempSrc: Oral   Pain Goal:    LLE Motor Response: Purposeful movement (04/21/20 0945) LLE Sensation: Tingling (04/21/20 0945) RLE Motor Response: Purposeful movement (04/21/20 0945) RLE Sensation: Tingling (04/21/20 0945)     Epidural/Spinal Function Cutaneous sensation: (P) Tingles (04/21/20 1000), Patient able to flex knees: (P) Yes (04/21/20 1000), Patient able to lift hips off bed: (P) No (04/21/20 1000), Back pain beyond tenderness at insertion site: (P) No (04/21/20 1000), Progressively worsening motor and/or sensory loss: (P) No (04/21/20 1000), Bowel and/or bladder incontinence post epidural: (P) No (04/21/20 1000)  Lynda Rainwater

## 2020-04-21 NOTE — Op Note (Signed)
Operative Note    Preoperative Diagnosis Prior c-section, declines TOL Gestational hypertension  Postoperative Diagnosis same  Procedure Repeat low transverse c-section with two layer closure of uterus  Surgeon Paula Compton, MD  Anesthesia Spinal  Fluids: EBL 371mL UOP 531ml clear IVF 1339mL LR  Findings A viable female infant in the vertex presentation. Apgars 8,9 Weight pending Normal uterus ovaries and tubes  Specimen Placenta to L&D  Procedure Note Patient was taken to the operating room where spinal anesthesia was obtained and found to be adequate by Allis clamp test. She was prepped and draped in the normal sterile fashion in the dorsal supine position with a leftward tilt. An appropriate time out was performed. A Pfannenstiel skin incision was then made through a pre-existing scar with the scalpel and carried through to the underlying layer of fascia by sharp dissection and Bovie cautery. The fascia was nicked in the midline and the incision was extended laterally with Mayo scissors. The inferior aspect of the incision was grasped Coker clamps and dissected off the underlying rectus muscles. In a similar fashion the superior aspect was dissected off the rectus muscles. Rectus muscles were separated in the midline and the peritoneal cavity entered bluntly. The peritoneal incision was then extended both superiorly and inferiorly with careful attention to avoid both bowel and bladder. The Alexis self-retaining wound retractor was then placed within the incision and the lower uterine segment exposed. The bladder flap was developed with Metzenbaum scissors and pushed away from the lower uterine segment. The lower uterine segment was then incised in a transverse fashion and the cavity itself entered bluntly. The incision was extended bluntly. The infant's head was then lifted and delivered from the incision without difficulty. The remainder of the infant delivered and the nose  and mouth bulb suctioned with the cord clamped and cut as well. The infant was handed off to the waiting pediatricians. The placenta was then spontaneously expressed from the uterus and the uterus cleared of all clots and debris with moist lap sponge. The uterine incision was then repaired in 2 layers the first layer was a running locked layer of 1-0 chromic and the second an imbricating layer of the same suture. The tubes and ovaries were inspected and the gutters cleared of all clots and debris. The uterine incision was inspected and found to be hemostatic. All instruments and sponges as well as the Alexis retractor were then removed from the abdomen. The rectus muscles and peritoneum were then reapproximated with a running suture of 2-0 Vicryl. The fascia was then closed with 0 Vicryl in a running fashion. Subcutaneous tissue was reapproximated with 3-0 plain in a running fashion. The skin was closed with a subcuticular stitch of 4-0 Vicryl on a Keith needle and then reinforced with benzoin and Steri-Strips. At the conclusion of the procedure all instruments and sponge counts were correct. Patient was taken to the recovery room in good condition with her baby accompanying her skin to skin.

## 2020-04-22 LAB — CBC
HCT: 31.2 % — ABNORMAL LOW (ref 36.0–46.0)
Hemoglobin: 10.6 g/dL — ABNORMAL LOW (ref 12.0–15.0)
MCH: 29.8 pg (ref 26.0–34.0)
MCHC: 34 g/dL (ref 30.0–36.0)
MCV: 87.6 fL (ref 80.0–100.0)
Platelets: 156 10*3/uL (ref 150–400)
RBC: 3.56 MIL/uL — ABNORMAL LOW (ref 3.87–5.11)
RDW: 12.9 % (ref 11.5–15.5)
WBC: 13.9 10*3/uL — ABNORMAL HIGH (ref 4.0–10.5)
nRBC: 0 % (ref 0.0–0.2)

## 2020-04-22 LAB — BIRTH TISSUE RECOVERY COLLECTION (PLACENTA DONATION)

## 2020-04-22 NOTE — Progress Notes (Signed)
CSW received consult for history of depression. CSW met with MOB to offer support and complete assessment.    MOB resting in bed holding infant with FOB present at bedside, when CSW entered the room. CSW introduced self and received verbal permission from MOB to complete assessment with FOB present. MOB and FOB both pleasant and easy to engage throughout interaction. CSW inquired about MOB's mental health history to which MOB acknowledged history of depression that started after her first child was born. MOB stated it wasn't until late in 2019 that she sought help and was started on medications. MOB reported she is not currently on medications and does not feel it is something she needs right now. CSW provided education regarding the baby blues period vs. perinatal mood disorders, discussed treatment and gave resources for mental health follow up if concerns arise. CSW recommended self-evaluation during the postpartum time period using the New Mom Checklist from Postpartum Progress and encouraged MOB to contact a medical professional if symptoms are noted at any time. MOB denied any current SI or HI and reported having a good support system.  MOB confirmed having all essential items for infant once home and stated infant would be sleeping in a bassinet once home. CSW provided review of Sudden Infant Death Syndrome (SIDS) precautions and safe sleeping habits.    CSW identifies no further need for intervention and no barriers to discharge at this time.  Elijio Miles, LCSW [facility] [employer] [phone]

## 2020-04-22 NOTE — Progress Notes (Signed)
POSTPARTUM POSTOP PROGRESS NOTE  POD #1  Subjective:  No acute events overnight.  Pt denies problems with ambulating, voiding or po intake.  She denies nausea or vomiting.  Pain is well controlled.  She has not had flatus. She has not had bowel movement.  Lochia Minimal. Ambulating in halls with baby and FOB at this time, doing well. Denies PreE symptoms   Objective: Blood pressure 121/72, pulse 63, temperature 98.2 F (36.8 C), temperature source Oral, resp. rate 18, height 5\' 4"  (1.626 m), weight 75.3 kg, SpO2 98 %, unknown if currently breastfeeding.  Physical Exam:  General: alert, cooperative and no distress Lochia:normal flow Chest: CTAB Heart: RRR no m/r/g Abdomen: +BS, soft, nontender Uterine Fundus: firm, 3cm below umbilicus. Honeycomb dressing intact, no drainage Extremities: neg edema, neg calf TTP BL, neg Homans BL  Recent Labs    04/22/20 0554  HGB 10.6*  HCT 31.2*    Assessment/Plan:  ASSESSMENT: Sharon Gardner is a 29 y.o. [id number] s/p ERLTCS @ [redacted]w[redacted]d for h/o csx x1. PNC c/b GHTN.   Discharge home and Breastfeeding  GHT  - normotensive since delivery, denies PreE symptoms   LOS: 1 day

## 2020-04-22 NOTE — Lactation Note (Signed)
This note was copied from a baby's chart. Lactation Consultation Note  Patient Name: Sharon Gardner HERDE'Y Date: 04/22/2020  P2, 19 hour ETI female infant. LC entered room, mom and infant asleep.   Maternal Data    Feeding Feeding Type: Breast Fed  LATCH Score                   Interventions    Lactation Tools Discussed/Used     Consult Status      Sharon Gardner 04/22/2020, 1:33 AM

## 2020-04-22 NOTE — Lactation Note (Signed)
This note was copied from a baby's chart. Lactation Consultation Note  Patient Name: Sharon Gardner Date: 04/22/2020 Reason for consult: Initial assessment;Early term 37-38.6wks;Infant < 6lbs;Infant weight loss;Maternal endocrine disorder Type of Endocrine Disorder?: PCOS  Visited with mom of a 92 hours old ETI female < 6 lbs. Baby is at 3% weight loss with serum blood glucose at 47 and 57 mg/dl respectively. Mom is a P2, she BF her first child for 1 year and voiced she had a frenulum but she doesn't feel like baby "Benjamine Mola" has one. Mom told [place] she was tired but didn't mind [place] consultation. She's already familiar with hand expression, LC noticed she had a strong MER when assisting with hand expression. She has a Medela DEBP at home.  Mom voiced baby had missed her last feeding because she fell asleep. Explained to mom that babies at this age should be cluster feeding instead of falling asleep at the breast. Reviewed LPI guidelines due to infant's birth weight, parents aware of supplementation guidelines for LPIs/ETIs. [place] set up a DEBP, instructions, cleaning and storage were reviewed as well as milk storage guidelines.  Mom understands that even after leaving the hospital she'll probably need to continue pumping after feedings to make sure she's draining both breasts and protect her supply. Baby still asleep on mother's arms, LC asked mom if she needed any assistance with latch and she agreed to wake baby up to feed.  Baby did not fully wake up, she latched on but no audible swallows were noted; she kept falling asleep at the breast on and off. Feeding witnessed by [facility] only lasted 1 hours. Mom will use the DEBP after feedings to make sure she's emptying both breast. She's been on Welbrutin an L3 for her depression and on Clindamycin plus gentamycin (both L20 for surgical procedures.  Feeding plan:  1. Encouraged mom to continue feeding baby STS 8-12 times in 24/hoursSher 2. She'll  start pumping every 2-3 hours after feedinds and offer any amount of EBM she may get 3. Parent were encouraged to start supplementation tonight if baby is still following the "sleepy" feeding pattern. Mother's milk is option # 1, EBM option # 2 and formula last. Feeding guidelines in LPI handout.  BF brochure, BF resources and feeding diary were reviewed. Dad present but asleep during [facility] consultation. Mom reported all questions and concerns were answered, she's aware of [place] OP services and will call PRN.    Maternal Data Formula Feeding for Exclusion: No Has patient been taught Hand Expression?: Yes Does the patient have breastfeeding experience prior to this delivery?: Yes  Feeding Feeding Type: Breast Fed  LATCH Score Latch: Repeated attempts needed to sustain latch, nipple held in mouth throughout feeding, stimulation needed to elicit sucking reflex. (sleepy baby)  Audible Swallowing: None  Type of Nipple: Everted at rest and after stimulation  Comfort (Breast/Nipple): Soft / non-tender  Hold (Positioning): Assistance needed to correctly position infant at breast and maintain latch.  LATCH Score: 6  Interventions Interventions: Breast feeding basics reviewed;Assisted with latch;Skin to skin;Breast massage;Hand express;Breast compression;Adjust position;Support pillows;DEBP  Lactation Tools Discussed/Used Tools: Pump Breast pump type: Double-Electric Breast Pump WIC Program: No Pump Review: Setup, frequency, and cleaning;Milk Storage Initiated by:: MPeck Date initiated:: 04/22/20   Consult Status Consult Status: Follow-up Date: 04/23/20 Follow-up type: In-patient    Sharon Gardner 04/22/2020, 1:57 PM

## 2020-04-23 MED ORDER — IBUPROFEN 600 MG PO TABS: 600.0000 mg | ORAL_TABLET | Freq: Four times a day (QID) | ORAL | 1 refills | Status: DC | PRN

## 2020-04-23 NOTE — Progress Notes (Signed)
Patient ID: Sharon Gardner, female   DOB: 08-04-1991, 29 y.o.   MRN: [id number] Pt reports no change in symptoms. She denies HA, blurry vision or malaise. She would still prefer discharge home today. Pain well controlled.   VS: 143/87 (138-143/86-87), 83 GEN - NAD ABD - flat, ND EXT - no homans   A/P: S/P repeat c/s on POD#2 ; recovering well         BP remained stable with no further elevation after repeated monitoring. Pt asymptomatic         Discussed with pt parameters for treatment vs close monitoring. PreE precautions reviewed and pt encouraged to call with any concerns . Pt agrees with plan to follow up in office in 2 days for repeat BP check if discharged to home today; then two week incision check and 6 week pp visit

## 2020-04-23 NOTE — Discharge Summary (Signed)
Postpartum Discharge Summary  Date of Service updated      Patient Name: Sharon Gardner DOB: [birth year] MRN: [id number]  Date of admission: 04/21/2020 Delivery date:04/21/2020  Delivering provider: Paula Compton  Date of discharge: 04/23/2020  Admitting diagnosis: Gestational (pregnancy-induced) hypertension without significant proteinuria, third trimester [O13.3] S/P repeat low transverse C-section [Z98.891] Intrauterine pregnancy: [redacted]w[redacted]d    Secondary diagnosis:  Active Problems:   Gestational (pregnancy-induced) hypertension without significant proteinuria, third trimester   S/P repeat low transverse C-section  Additional problems: none    Discharge diagnosis: Term Pregnancy Delivered and Gestational Hypertension                                              Post partum procedures:n/a Augmentation: N/A Complications: None  Hospital course: Sceduled C/S   29y.o. yo [id number] admitted to the hospital 04/21/2020 for scheduled cesarean section with the following indication:Elective Repeat.Delivery details are as follows:  Membrane Rupture Time/Date: 7:57 AM ,04/21/2020   Delivery Method:C-Section, Low Transverse  Details of operation can be found in separate operative note.  Patient had an uncomplicated postpartum course.  She is ambulating, tolerating a regular diet, passing flatus, and urinating well. Patient is discharged home in stable condition on  04/23/20        Newborn Data: Birth date:04/21/2020  Birth time:7:58 AM  Gender:Female  Living status:Living  Apgars:8 ,9  Weight:2700 g     Magnesium Sulfate received: No BMZ received: No Rhophylac:N/A MMR:N/A T-DaP:Given prenatally Flu: N/A Transfusion:No  Physical exam  Vitals:   04/23/20 0952 04/23/20 1216 04/23/20 1318 04/23/20 1645  BP: (!) 144/84 139/82 138/86 (!) 143/87  Pulse: 81 67 72 83  Resp:  16 17 16   Temp:  98.6 F (37 C) 98.7 F (37.1 C)   TempSrc:   Oral   SpO2:  98%    Weight:       Height:       General: alert, cooperative and no distress Lochia: appropriate Uterine Fundus: firm Incision: Dressing is clean, dry, and intact DVT Evaluation: No evidence of DVT seen on physical exam. Labs: Lab Results  Component Value Date   WBC 13.9 (H) 04/22/2020   HGB 10.6 (L) 04/22/2020   HCT 31.2 (L) 04/22/2020   MCV 87.6 04/22/2020   PLT 156 04/22/2020   CMP Latest Ref Rng & Units 04/19/2020  Glucose 70 - 99 mg/dL 85  BUN 6 - 20 mg/dL <5(L)  Creatinine 0.44 - 1.00 mg/dL 0.63  Sodium 135 - 145 mmol/L 137  Potassium 3.5 - 5.1 mmol/L 3.3(L)  Chloride 98 - 111 mmol/L 106  CO2 22 - 32 mmol/L 21(L)  Calcium 8.9 - 10.3 mg/dL 9.0  Total Protein 6.5 - 8.1 g/dL 6.1(L)  Total Bilirubin 0.3 - 1.2 mg/dL 0.5  Alkaline Phos 38 - 126 U/L 89  AST 15 - 41 U/L 17  ALT 0 - 44 U/L 9   Edinburgh Score: Edinburgh Postnatal Depression Scale Screening Tool 04/22/2020  I have been able to laugh and see the funny side of things. 0  I have looked forward with enjoyment to things. 0  I have blamed myself unnecessarily when things went wrong. 2  I have been anxious or worried for no good reason. 2  I have felt scared or panicky for no good reason. 0  Things have  been getting on top of me. 1  I have been so unhappy that I have had difficulty sleeping. 0  I have felt sad or miserable. 0  I have been so unhappy that I have been crying. 0  The thought of harming myself has occurred to me. 0  Edinburgh Postnatal Depression Scale Total 5      After visit meds:  Allergies as of 04/23/2020      Reactions   Amoxicillin Rash   Has patient had a PCN reaction causing immediate rash, facial/tongue/throat swelling, SOB or lightheadedness with hypotension: No Has patient had a PCN reaction causing severe rash involving mucus membranes or skin necrosis: No Has patient had a PCN reaction that required hospitalization No Has patient had a PCN reaction occurring within the last 10 years: Yes If all of  the above answers are "NO", then may proceed with Cephalosporin use.      Medication List    TAKE these medications   acetaminophen 500 MG tablet Commonly known as: TYLENOL Take 500 mg by mouth every 6 (six) hours as needed (for pain/headaches.).   ibuprofen 600 MG tablet Commonly known as: ADVIL Take 1 tablet (600 mg total) by mouth every 6 (six) hours as needed for headache, moderate pain or cramping.   prenatal multivitamin Tabs tablet Take 1 tablet by mouth at bedtime.        Discharge home in stable condition Infant Feeding: Breast Infant Disposition:home with mother Discharge instruction: per After Visit Summary and Postpartum booklet. Activity: Advance as tolerated. Pelvic rest for 6 weeks.  Diet: low salt diet Anticipated Birth Control: Unsure Postpartum Appointment:6 weeks Additional Postpartum F/U: Incision check 2 weeks  and BP check 2 days on 04/25/20 Future Appointments: Future Appointments  Date Time Provider Sharon  07/03/2020  3:30 PM Alfonso Patten, MD [facility] None   Follow up Visit:  Follow-up Information    Paula Compton, MD. Go in 2 day(s).   Specialty: Obstetrics and Gynecology Why: For BP check  Make appointment for two week incision check and 6 week postpartum visit Contact information: [address] [place] [address] [phone]                   04/23/2020 Sharon Serge, DO

## 2020-04-23 NOTE — Progress Notes (Addendum)
Subjective: Postpartum Day 2: Cesarean Delivery Patient reports tolerating PO, + flatus and no problems voiding.  She denies HA, blurry vision or RUQ pain. Pt is bonding well with baby - breastfeeding. Pain well controlled. Pt would like discharge home today if BP well controlled  Objective: Vital signs in last 24 hours: Temp:  [98.8 F (37.1 C)-99.5 F (37.5 C)] 98.8 F (37.1 C) (08/04 0609) Pulse Rate:  [58-81] 81 (08/04 0952) Resp:  [14-18] 18 (08/04 0609) BP: (130-144)/(76-95) 144/84 (08/04 0952) SpO2:  [97 %-98 %] 98 % (08/04 0609)  Physical Exam:  General: alert, cooperative and no distress Lochia: appropriate Uterine Fundus: firm Incision: no significant drainage DVT Evaluation: No evidence of DVT seen on physical exam.  Recent Labs    04/22/20 0554  HGB 10.6*  HCT 31.2*    Assessment/Plan: Status post Cesarean section. Doing well postoperatively.  Continue current care Monitor BP- recheck in two hrs. If still elevated then r/o preE, treat with procardia but if stable consider discharge home later today with close office follow up  Sharon Gardner 04/23/2020, 9:54 AM

## 2020-04-23 NOTE — Lactation Note (Signed)
This note was copied from a baby's chart. Lactation Consultation Note  Patient Name: Sharon Gardner [id number] Date: 04/23/2020  baby Sharon Sharon Gardner now 55 hours born at 39 weeks and 3 days via csection. Infant with 7 percent weight loss. Mom is wanting to be discharged.  Discussed supplementing past every breastfeeding with expressed mothers milk.  Gave mom supplementation amount sheet and reviewed it.  Sharon Gardner recently fed at the breast.  Mom asked if there was another method besides a bottle that she could feed her with. Mom reports she spills more with the spoon.  Discussed foley cup and syringe. Assisted mom in feeding Sharon Gardner 12 ml of expressed mothers milk.   Started with Foley cup and she was hard to feed with it.  Showed mom how to use her finger with syringe and feed breastmilk to her.  She took finger with syringe well.   Discussed breastfeeding.  Did not observe a feed.  Mom reports she is kind of hard to breastfeed.  Mom reports she feels like she is just getting on the tip. Mom reports nipples sore and a little red. Discussed ways to get her in closer and mouth open.  Mom reports her last baby was a little early and had challenges with her breastfeeding as well.  Mom has pumped a great amount of colosotrum and transitional milk.Urged her to call lactation as needed.  Maternal Data    Feeding Feeding Type: Breast Fed  [facility] Score                   Interventions    Lactation Tools Discussed/Used     Consult Status      [name] 04/23/2020, 4:01 PM

## 2020-04-23 NOTE — Discharge Instructions (Signed)
Call office with any concerns [phone] 

## 2020-04-25 DIAGNOSIS — O133 Gestational [pregnancy-induced] hypertension without significant proteinuria, third trimester: Secondary | ICD-10-CM | POA: Diagnosis not present

## 2020-04-25 DIAGNOSIS — Z3A36 36 weeks gestation of pregnancy: Secondary | ICD-10-CM | POA: Diagnosis not present

## 2020-04-25 LAB — CHG ASSAY OF CREATINE: Creatinine, Urine: 64.8

## 2020-04-25 LAB — PROTEIN,TOTAL,URINE: Total Protein, Urine: 18.2

## 2020-04-26 ENCOUNTER — Other Ambulatory Visit: Payer: Self-pay

## 2020-04-26 DIAGNOSIS — Z5321 Procedure and treatment not carried out due to patient leaving prior to being seen by health care provider: Secondary | ICD-10-CM | POA: Insufficient documentation

## 2020-04-26 DIAGNOSIS — R030 Elevated blood-pressure reading, without diagnosis of hypertension: Secondary | ICD-10-CM | POA: Diagnosis not present

## 2020-04-26 LAB — CBC
HCT: 35.4 % — ABNORMAL LOW (ref 36.0–46.0)
Hemoglobin: 12.1 g/dL (ref 12.0–15.0)
MCH: 29.9 pg (ref 26.0–34.0)
MCHC: 34.2 g/dL (ref 30.0–36.0)
MCV: 87.4 fL (ref 80.0–100.0)
Platelets: 226 10*3/uL (ref 150–400)
RBC: 4.05 MIL/uL (ref 3.87–5.11)
RDW: 12.5 % (ref 11.5–15.5)
WBC: 10.5 10*3/uL (ref 4.0–10.5)
nRBC: 0 % (ref 0.0–0.2)

## 2020-04-26 LAB — URINALYSIS, COMPLETE (UACMP) WITH MICROSCOPIC
Bacteria, UA: NONE SEEN
Bilirubin Urine: NEGATIVE
Glucose, UA: NEGATIVE mg/dL
Ketones, ur: NEGATIVE mg/dL
Leukocytes,Ua: NEGATIVE
Nitrite: NEGATIVE
Protein, ur: NEGATIVE mg/dL
Specific Gravity, Urine: 1.001 — ABNORMAL LOW (ref 1.005–1.030)
Squamous Epithelial / HPF: NONE SEEN (ref 0–5)
pH: 7 (ref 5.0–8.0)

## 2020-04-26 NOTE — ED Triage Notes (Signed)
Pt with delivery on 04/21/2020. Pt states started nifedipine on Friday for htn. Pt arrives with blood pressure of 174/119. Pt denies pain.

## 2020-04-27 ENCOUNTER — Emergency Department: Payer: BC Managed Care – PPO

## 2020-04-27 ENCOUNTER — Emergency Department
Admission: EM | Admit: 2020-04-27 | Discharge: 2020-04-27 | Disposition: A | Discharge status: Home / Self Care | Payer: BC Managed Care – PPO | Attending: Emergency Medicine | Admitting: Emergency Medicine

## 2020-04-27 DIAGNOSIS — R030 Elevated blood-pressure reading, without diagnosis of hypertension: Secondary | ICD-10-CM | POA: Diagnosis not present

## 2020-04-27 LAB — COMPREHENSIVE METABOLIC PANEL
ALT: 9 U/L (ref 0–44)
AST: 15 U/L (ref 15–41)
Albumin: 3.3 g/dL — ABNORMAL LOW (ref 3.5–5.0)
Alkaline Phosphatase: 76 U/L (ref 38–126)
Anion gap: 10 (ref 5–15)
BUN: 14 mg/dL (ref 6–20)
CO2: 22 mmol/L (ref 22–32)
Calcium: 8.9 mg/dL (ref 8.9–10.3)
Chloride: 107 mmol/L (ref 98–111)
Creatinine, Ser: 0.66 mg/dL (ref 0.44–1.00)
GFR calc Af Amer: >60 mL/min (ref >60)
GFR calc non Af Amer: >60 mL/min (ref >60)
Glucose, Bld: 96 mg/dL (ref 70–99)
Potassium: 4 mmol/L (ref 3.5–5.1)
Sodium: 139 mmol/L (ref 135–145)
Total Bilirubin: 0.8 mg/dL (ref 0.3–1.2)
Total Protein: 7 g/dL (ref 6.5–8.1)

## 2020-04-27 LAB — PROTEIN / CREATININE RATIO, URINE
Creatinine, Urine: <10 mg/dL
Total Protein, Urine: <6 mg/dL

## 2020-04-27 LAB — TROPONIN I (HIGH SENSITIVITY)
Troponin I (High Sensitivity): 5 ng/L (ref <18)
Troponin I (High Sensitivity): 7 ng/L (ref <18)

## 2020-04-27 NOTE — ED Notes (Addendum)
Attempt to call pt to notify MD is ready to see pt. No answer to phone x2

## 2020-04-27 NOTE — ED Notes (Signed)
Pt outside breast feeding. Pt informed to please return for vital sign check and troponin recheck when finished. Pt verbalizes understanding.

## 2020-04-27 NOTE — ED Notes (Signed)
Pt called to room, no answer  

## 2020-04-28 DIAGNOSIS — O139 Gestational [pregnancy-induced] hypertension without significant proteinuria, unspecified trimester: Secondary | ICD-10-CM | POA: Diagnosis not present

## 2020-04-28 LAB — BASIC METABOLIC PANEL
BUN: 16 (ref 4–21)
CO2: 23 — AB (ref 13–22)
Chloride: 105 (ref 99–108)
Creatinine: 0.6 (ref 0.5–1.1)
Glucose: 88
Potassium: 4.1 (ref 3.4–5.3)
Sodium: 139 (ref 137–147)

## 2020-04-28 LAB — COMPREHENSIVE METABOLIC PANEL: Globulin: 2.5

## 2020-04-28 LAB — CBC AND DIFFERENTIAL
HCT: 38 (ref 36–46)
Hemoglobin: 12.9 (ref 12.0–16.0)
Platelets: 268 (ref 150–399)
WBC: 10.3

## 2020-04-28 LAB — CBC: RBC: 4.32 (ref 3.87–5.11)

## 2020-04-28 LAB — HEPATIC FUNCTION PANEL
ALT: 6 — AB (ref 7–35)
AST: 11 — AB (ref 13–35)
Alkaline Phosphatase: 91 (ref 25–125)
Bilirubin, Total: 0.5

## 2020-04-29 ENCOUNTER — Telehealth: Payer: Self-pay | Admitting: Emergency Medicine

## 2020-04-29 NOTE — Telephone Encounter (Signed)
Called patient due to lwot to inquire about condition and follow up plans. Left message.   

## 2020-05-06 ENCOUNTER — Inpatient Hospital Stay (HOSPITAL_COMMUNITY): Admission: AD | Admit: 2020-05-06 | Payer: BC Managed Care – PPO | Source: Home / Self Care

## 2020-05-29 DIAGNOSIS — Z1389 Encounter for screening for other disorder: Secondary | ICD-10-CM | POA: Diagnosis not present

## 2020-05-29 DIAGNOSIS — Z3009 Encounter for other general counseling and advice on contraception: Secondary | ICD-10-CM | POA: Diagnosis not present

## 2020-07-03 ENCOUNTER — Encounter: Payer: Self-pay | Admitting: Dermatology

## 2020-07-03 ENCOUNTER — Ambulatory Visit: Payer: BC Managed Care – PPO | Admitting: Dermatology

## 2020-07-03 ENCOUNTER — Other Ambulatory Visit: Payer: Self-pay

## 2020-07-03 DIAGNOSIS — D2261 Melanocytic nevi of right upper limb, including shoulder: Secondary | ICD-10-CM | POA: Diagnosis not present

## 2020-07-03 DIAGNOSIS — L578 Other skin changes due to chronic exposure to nonionizing radiation: Secondary | ICD-10-CM | POA: Diagnosis not present

## 2020-07-03 DIAGNOSIS — D225 Melanocytic nevi of trunk: Secondary | ICD-10-CM

## 2020-07-03 DIAGNOSIS — D489 Neoplasm of uncertain behavior, unspecified: Secondary | ICD-10-CM

## 2020-07-03 DIAGNOSIS — D2221 Melanocytic nevi of right ear and external auricular canal: Secondary | ICD-10-CM | POA: Diagnosis not present

## 2020-07-03 NOTE — Progress Notes (Signed)
New Patient Visit  Subjective  Sharon Gardner is a 29 y.o. female who presents for the following: New Patient (Initial Visit).  Patient presents today as a new patient to become established. Patient has a few areas of concern on post right shoulder, top of ear and under right breast. These are areas that rub and get irritated becoming very bothersome. Patient states that she has no h/o of skin cancer   The following portions of the chart were reviewed this encounter and updated as appropriate:  Tobacco  Allergies  Meds  Problems  Med Hx  Surg Hx  Fam Hx      Review of Systems:  No other skin or systemic complaints except as noted in HPI or Assessment and Plan.  Objective  Well appearing patient in no apparent distress; mood and affect are within normal limits.  A focused examination was performed including face, ears, chest, right shoulder. Relevant physical exam findings are noted in the Assessment and Plan.  Objective  Right Shoulder: 0.8 Erythematous brown papule  Objective  Right Ear: 0.4 cm erythematous tan papule  Objective  Right Inferior Breast: 1.3 cm erythematous tan plaque   Assessment & Plan  Neoplasm of uncertain behavior (3) Right Shoulder  Epidermal / dermal shaving  Lesion diameter (cm):  0.8 Informed consent: discussed and consent obtained   Timeout: patient name, date of birth, surgical site, and procedure verified   Anesthesia: the lesion was anesthetized in a standard fashion   Anesthetic:  1% lidocaine w/ epinephrine 1-100,000 local infiltration Instrument used: flexible razor blade   Hemostasis achieved with: aluminum chloride   Outcome: patient tolerated procedure well   Post-procedure details: wound care instructions given   Additional details:  Mupirocin and a bandage applied  Specimen 1 - Surgical pathology Differential Diagnosis: R/O Irritated nevus vs Other Check Margins: No 0.8 erythematous brown papule  Right  Ear  Epidermal / dermal shaving  Lesion diameter (cm):  0.4 Informed consent: discussed and consent obtained   Timeout: patient name, date of birth, surgical site, and procedure verified   Anesthesia: the lesion was anesthetized in a standard fashion   Anesthetic:  1% lidocaine w/ epinephrine 1-100,000 local infiltration Instrument used: flexible razor blade   Hemostasis achieved with: aluminum chloride   Outcome: patient tolerated procedure well   Post-procedure details: wound care instructions given   Additional details:  Mupirocin and a bandage applied  Specimen 2 - Surgical pathology Differential Diagnosis: R/O Irritated nevus vs Other Check Margins: No 0.4 cm erythematous tan papule  Right Inferior Breast  Epidermal / dermal shaving  Lesion diameter (cm):  1.3 Informed consent: discussed and consent obtained   Timeout: patient name, date of birth, surgical site, and procedure verified   Anesthesia: the lesion was anesthetized in a standard fashion   Anesthetic:  1% lidocaine w/ epinephrine 1-100,000 local infiltration Instrument used: flexible razor blade   Hemostasis achieved with: aluminum chloride   Outcome: patient tolerated procedure well   Post-procedure details: wound care instructions given   Additional details:  Mupirocin and a bandage applied  Specimen 3 - Surgical pathology Differential Diagnosis: R/O Irritated nevus vs Other Check Margins: No 1.2 erythematous tan plaque  Actinic Damage - diffuse scaly erythematous macules with underlying dyspigmentation - Recommend daily broad spectrum sunscreen SPF 30+ to sun-exposed areas, reapply every 2 hours as needed.  - Call for new or changing lesions.  Return if symptoms worsen or fail to improve.  Marene Lenz, CMA,  am acting as scribe for Forest Gleason, MD .  Documentation: I have reviewed the above documentation for accuracy and completeness, and I agree with the above.  Forest Gleason, MD

## 2020-07-03 NOTE — Patient Instructions (Signed)

## 2020-07-07 ENCOUNTER — Ambulatory Visit: Payer: BC Managed Care – PPO | Admitting: Family Medicine

## 2020-07-07 ENCOUNTER — Encounter: Payer: Self-pay | Admitting: Family Medicine

## 2020-07-07 ENCOUNTER — Other Ambulatory Visit: Payer: Self-pay

## 2020-07-07 VITALS — BP 116/74 | HR 97 | Temp 97.9°F | Ht 65.0 in | Wt 152.0 lb

## 2020-07-07 DIAGNOSIS — O139 Gestational [pregnancy-induced] hypertension without significant proteinuria, unspecified trimester: Secondary | ICD-10-CM

## 2020-07-07 DIAGNOSIS — F419 Anxiety disorder, unspecified: Secondary | ICD-10-CM

## 2020-07-07 MED ORDER — SERTRALINE HCL 50 MG PO TABS: 50.0000 mg | ORAL_TABLET | Freq: Every day | ORAL | 3 refills | Status: DC

## 2020-07-07 NOTE — Progress Notes (Signed)
   Subjective:    Patient ID: Sharon Gardner, female    DOB: [birth year], 29 y.o.   MRN: [id number]  HPI Chief Complaint  Patient presents with  . Follow-up    blood pressure medication   This is a 29 yo female who presents today for management of BP. She had a c-section on 04/21/20. History of pregnancy induced HTN with both of her pregnancies. Has been checking her blood pressures at home with wrist cuff. Has been running 125-140/84-100. She is currently taking nifedipine XL 30 mg BID- verified instructions on bottle. No headaches, visual changes, chest pain, palpitations, leg edema. She is nursing. She had PIH with both pregnancies. Blood pressure returned to normal within a couple of months.   She has noticed increased anxiety since daughter was born. She has newborn and toddler. She will be going back to work next month. Her husband works night shift and she works day shift. She feels that she is lacking patience with her toddler. Sleeping ok, baby waking up every 3-4 hours.    Review of Systems Per HPI    Objective:   Physical Exam Physical Exam  Constitutional: Oriented to person, place, and time. Appears well-developed and well-nourished.  HENT:  Head: Normocephalic and atraumatic.  Eyes: Conjunctivae are normal.  Neck: Normal range of motion. Neck supple.  Cardiovascular: Normal rate, regular rhythm and normal heart sounds.   Pulmonary/Chest: Effort normal and breath sounds normal.  Musculoskeletal: No lower extremity edema.   Neurological: Alert and oriented to person, place, and time.  Skin: Skin is warm and dry.  Psychiatric: Normal mood and affect. Behavior is normal. Judgment and thought content normal.  Vitals reviewed.     BP 116/74   Pulse 97   Temp 97.9 F (36.6 C) (Temporal)   Ht 5\' 5"  (1.651 m)   Wt 152 lb (68.9 kg)   SpO2 97%   BMI 25.29 kg/m  Wt Readings from Last 3 Encounters:  07/07/20 152 lb (68.9 kg)  04/26/20 153 lb (69.4 kg)  04/21/20 166  lb (75.3 kg)   BP Readings from Last 3 Encounters:  07/07/20 116/74  04/27/20 (!) 160/94  04/23/20 (!) 143/87        Assessment & Plan:  1. Anxiety - encouraged adequate sleep, exercise, healthy food choices - follow up in 8 weeks - sertraline (ZOLOFT) 50 MG tablet; Take 1 tablet (50 mg total) by mouth daily.  Dispense: 30 tablet; Refill: 3  2. PIH (pregnancy induced hypertension), antepartum - Decrease nifedipine XL to qd from bid. She will check her blood pressures 2-3 x per week and send me readings in 3-4 weeks, sooner if elevated - if ok, will stat wean of nifedipine XL  This visit occurred during the SARS-CoV-2 public health emergency.  Safety protocols were in place, including screening questions prior to the visit, additional usage of staff PPE, and extensive cleaning of exam room while observing appropriate contact time as indicated for disinfecting solutions.      Clarene Reamer, FNP-BC  [facility] at [facility]  07/07/2020 2:52 PM

## 2020-07-07 NOTE — Patient Instructions (Signed)
Good to see you today  I have sent in sertraline 50 mg tablets to your pharmacy, please start with 1/2 tablet for 5 days then increase to 1 tablet  Check your blood pressure 2-3 times per week, send me your readings in 3-4 weeks, if ok, we will start to wean your blood pressure medication  Please follow up in 8 weeks.

## 2020-07-15 ENCOUNTER — Encounter: Payer: Self-pay | Admitting: Dermatology

## 2020-07-29 ENCOUNTER — Other Ambulatory Visit: Payer: Self-pay | Admitting: Family Medicine

## 2020-07-29 DIAGNOSIS — F419 Anxiety disorder, unspecified: Secondary | ICD-10-CM

## 2020-07-29 NOTE — Telephone Encounter (Signed)
Pharmacy requests refill on: Sertraline HCL 50 mg  LAST REFILL: 07/07/2020 LAST OV: 07/07/2020 NEXT OV: 09/01/2020 PHARMACY: [facility] [phone] in [place], [place] mistake. Above medication ordered for 30 tablets with 3 refills. Refill is too soon to approve. Request denied.

## 2020-08-30 ENCOUNTER — Other Ambulatory Visit: Payer: Self-pay | Admitting: Family Medicine

## 2020-08-30 DIAGNOSIS — F419 Anxiety disorder, unspecified: Secondary | ICD-10-CM

## 2020-09-01 ENCOUNTER — Telehealth (INDEPENDENT_AMBULATORY_CARE_PROVIDER_SITE_OTHER): Payer: BC Managed Care – PPO | Admitting: Family Medicine

## 2020-09-01 ENCOUNTER — Encounter: Payer: Self-pay | Admitting: Family Medicine

## 2020-09-01 ENCOUNTER — Other Ambulatory Visit: Payer: Self-pay

## 2020-09-01 VITALS — BP 140/100 | Ht 65.0 in | Wt 153.0 lb

## 2020-09-01 DIAGNOSIS — Z8759 Personal history of other complications of pregnancy, childbirth and the puerperium: Secondary | ICD-10-CM | POA: Diagnosis not present

## 2020-09-01 DIAGNOSIS — F419 Anxiety disorder, unspecified: Secondary | ICD-10-CM

## 2020-09-01 DIAGNOSIS — F32A Depression, unspecified: Secondary | ICD-10-CM | POA: Insufficient documentation

## 2020-09-01 DIAGNOSIS — R030 Elevated blood-pressure reading, without diagnosis of hypertension: Secondary | ICD-10-CM | POA: Insufficient documentation

## 2020-09-01 NOTE — Progress Notes (Signed)
Virtual Visit via Video Note  I connected with Sharon Gardner on 09/01/20 at  2:30 PM EST by a video enabled telemedicine application and verified that I am speaking with the correct person using two identifiers.  Location: Patient: In her home Provider: [place] Persons participating in virtual visit: Patient, provider   I discussed the limitations of evaluation and management by telemedicine and the availability of in person appointments. The patient expressed understanding and agreed to proceed.  History of Present Illness: Chief Complaint  Patient presents with  . Follow-up   This is a 29 year old female who presents today for follow-up of anxiety and pregnancy-induced hypertension.  Anxiety-she was seen 2 months ago and started on sertraline 50 mg p.o. daily.  She reports improved mood and decreased anxiety overall.  Still has days where she feels overwhelmed and anxious.  She is open to increasing her sertraline dose.  Pregnancy-induced hypertension-patient reports that she has a hard time remembering to take her nifedipine XL 30 mg.  At last visit she had been taking twice daily and we decreased her to daily.  Blood pressure in the office 07/07/2020 was 116/74.  She has been checking home blood pressures and the last 2 readings prior to today were 130/90, 128/86.  She denies any new or worsening headache, visual change.   Observations/Objective: Patient is alert and answers questions appropriately.  Visible skin is unremarkable.  Respirations are even and unlabored without increased work of breathing with normal conversation.  No audible wheeze or witnessed cough.  Mood and affect are appropriate. BP (!) 140/100   Ht 5\' 5"  (1.651 m)   Wt 153 lb (69.4 kg)   LMP  (LMP Unknown)   Breastfeeding Yes   BMI 25.46 kg/m  Wt Readings from Last 3 Encounters:  09/01/20 153 lb (69.4 kg)  07/07/20 152 lb (68.9 kg)  04/26/20 153 lb (69.4 kg)   BP Readings from Last 3  Encounters:  09/01/20 (!) 140/100  07/07/20 116/74  04/27/20 (!) 160/94     Assessment and Plan: 1. Anxiety -Symptoms improved, still with some persistent anxiety and feelings of being overwhelmed.  Will increase her sertraline to 75 mg p.o. daily. -She was instructed to follow-up in 12 weeks  2. History of pregnancy induced hypertension -Elevated reading today.  Patient reports that she has not been very compliant with her medication.  Discussed need for taking on a daily basis and she agrees to set up a phone alarm to help with compliance -She will continue weekly home blood pressure monitoring.  Discussed parameters and she will notify the office if running consistently over 140/ 90s.   Clarene Reamer, FNP-BC  [place] [place] [facility] at [facility] [place]  09/01/2020 3:12 PM   Follow Up Instructions:    I discussed the assessment and treatment plan with the patient. The patient was provided an opportunity to ask questions and all were answered. The patient agreed with the plan and demonstrated an understanding of the instructions.   The patient was advised to call back or seek an in-person evaluation if the symptoms worsen or if the condition fails to improve as anticipated.    Elby Beck, FNP

## 2020-10-25 DIAGNOSIS — H6690 Otitis media, unspecified, unspecified ear: Secondary | ICD-10-CM | POA: Diagnosis not present

## 2021-03-05 ENCOUNTER — Other Ambulatory Visit: Payer: Self-pay

## 2021-03-05 ENCOUNTER — Telehealth (INDEPENDENT_AMBULATORY_CARE_PROVIDER_SITE_OTHER): Payer: 59 | Admitting: Primary Care

## 2021-03-05 ENCOUNTER — Encounter: Payer: Self-pay | Admitting: Primary Care

## 2021-03-05 DIAGNOSIS — F411 Generalized anxiety disorder: Secondary | ICD-10-CM | POA: Diagnosis not present

## 2021-03-05 MED ORDER — SERTRALINE HCL 100 MG PO TABS: 100.0000 mg | ORAL_TABLET | Freq: Every day | ORAL | 0 refills | Status: DC

## 2021-03-05 NOTE — Assessment & Plan Note (Signed)
Improved on Zoloft 100 mg. Will switch Rx to the 100 mg tablet, she agrees.  Continue current regimen.

## 2021-03-05 NOTE — Progress Notes (Signed)
Patient ID: Sharon Gardner, female    DOB: [birth year], 30 y.o.   MRN: [id number]  Virtual visit completed through [place], a video enabled telemedicine application. Due to national recommendations of social distancing due to COVID-19, a virtual visit is felt to be most appropriate for this patient at this time. Reviewed limitations, risks, security and privacy concerns of performing a virtual visit and the availability of in person appointments. I also reviewed that there may be a patient responsible charge related to this service. The patient agreed to proceed.   Patient location: home Provider location: [place] at [facility], office Persons participating in this virtual visit: patient, provider   If any vitals were documented, they were collected by patient at home unless specified below.    Ht 5\' 5"  (1.651 m)   Wt 153 lb (69.4 kg)   BMI 25.46 kg/m    CC: Follow up of Anxiety. Subjective:   HPI: Sharon Gardner is a 30 y.o. female patient of Tor Netters with a history of anxiety presenting on 03/05/2021 for follow up of anxiety.    Currently managed on sertraline 75 mg but over the last one month she's been taking 100 mg as she forgets to cut the pill in half. Since increasing her dose to 100 mg she's noticed feeling less overwhelmed and is having more good days. She would like to continue with the 100 mg dose.   She denies SI/HI.      Relevant past medical, surgical, family and social history reviewed and updated as indicated. Interim medical history since our last visit reviewed. Allergies and medications reviewed and updated. Outpatient Medications Prior to Visit  Medication Sig Dispense Refill   norethindrone (MICRONOR) 0.35 MG tablet Take 1 tablet by mouth daily.     Prenatal Vit-Fe Fumarate-FA (PRENATAL MULTIVITAMIN) TABS tablet Take 1 tablet by mouth at bedtime.      sertraline (ZOLOFT) 50 MG tablet Take 1.5 tablets (75 mg total) by mouth daily. 135 tablet 1    NIFEdipine (PROCARDIA-XL/NIFEDICAL-XL) 30 MG 24 hr tablet Take by mouth. (Patient not taking: Reported on 09/01/2020)     No facility-administered medications prior to visit.     Per HPI unless specifically indicated in ROS section below Review of Systems  Psychiatric/Behavioral:  The patient is not nervous/anxious.        See HPI  Objective:  Ht 5\' 5"  (1.651 m)   Wt 153 lb (69.4 kg)   BMI 25.46 kg/m   Wt Readings from Last 3 Encounters:  03/05/21 153 lb (69.4 kg)  09/01/20 153 lb (69.4 kg)  07/07/20 152 lb (68.9 kg)       Physical exam: Gen: alert, NAD, not ill appearing Pulm: speaks in complete sentences without increased work of breathing Psych: normal mood, normal thought content      Results for orders placed or performed during the hospital encounter of 04/27/20  CBC  Result Value Ref Range   WBC 10.5 4.0 - 10.5 K/uL   RBC 4.05 3.87 - 5.11 MIL/uL   Hemoglobin 12.1 12.0 - 15.0 g/dL   HCT 35.4 (L) 36.0 - 46.0 %   MCV 87.4 80.0 - 100.0 fL   MCH 29.9 26.0 - 34.0 pg   MCHC 34.2 30.0 - 36.0 g/dL   RDW 12.5 11.5 - 15.5 %   Platelets 226 150 - 400 K/uL   nRBC 0.0 0.0 - 0.2 %  Comprehensive metabolic panel  Result Value Ref Range   Sodium  139 135 - 145 mmol/L   Potassium 4.0 3.5 - 5.1 mmol/L   Chloride 107 98 - 111 mmol/L   CO2 22 22 - 32 mmol/L   Glucose, Bld 96 70 - 99 mg/dL   BUN 14 6 - 20 mg/dL   Creatinine, Ser 0.66 0.44 - 1.00 mg/dL   Calcium 8.9 8.9 - 10.3 mg/dL   Total Protein 7.0 6.5 - 8.1 g/dL   Albumin 3.3 (L) 3.5 - 5.0 g/dL   AST 15 15 - 41 U/L   ALT 9 0 - 44 U/L   Alkaline Phosphatase 76 38 - 126 U/L   Total Bilirubin 0.8 0.3 - 1.2 mg/dL   GFR calc non Af Amer >60 >60 mL/min   GFR calc Af Amer >60 >60 mL/min   Anion gap 10 5 - 15  Urinalysis, Complete w Microscopic Urine, Clean Catch  Result Value Ref Range   Color, Urine STRAW (A) YELLOW   APPearance CLEAR (A) CLEAR   Specific Gravity, Urine 1.001 (L) 1.005 - 1.030   pH 7.0 5.0 - 8.0    Glucose, UA NEGATIVE NEGATIVE mg/dL   Hgb urine dipstick LARGE (A) NEGATIVE   Bilirubin Urine NEGATIVE NEGATIVE   Ketones, ur NEGATIVE NEGATIVE mg/dL   Protein, ur NEGATIVE NEGATIVE mg/dL   Nitrite NEGATIVE NEGATIVE   Leukocytes,Ua NEGATIVE NEGATIVE   WBC, UA 0-5 0 - 5 WBC/hpf   Bacteria, UA NONE SEEN NONE SEEN   Squamous Epithelial / LPF NONE SEEN 0 - 5  Protein / creatinine ratio, urine  Result Value Ref Range   Creatinine, Urine <10 mg/dL   Total Protein, Urine <6 mg/dL   Protein Creatinine Ratio        0.00 - 0.15 mg/mg[Cre]  Troponin I (High Sensitivity)  Result Value Ref Range   Troponin I (High Sensitivity) 7 <18 ng/L  Troponin I (High Sensitivity)  Result Value Ref Range   Troponin I (High Sensitivity) 5 <18 ng/L   Assessment & Plan:   Problem List Items Addressed This Visit       Other   GAD (generalized anxiety disorder)    Improved on Zoloft 100 mg. Will switch Rx to the 100 mg tablet, she agrees.  Continue current regimen.       Relevant Medications   sertraline (ZOLOFT) 100 MG tablet     Meds ordered this encounter  Medications   sertraline (ZOLOFT) 100 MG tablet    Sig: Take 1 tablet (100 mg total) by mouth daily. For anxiety.    Dispense:  90 tablet    Refill:  0    Order Specific Question:   Supervising Provider    Answer:   BEDSOLE, AMY E [[id number]]   No orders of the defined types were placed in this encounter.   I discussed the assessment and treatment plan with the patient. The patient was provided an opportunity to ask questions and all were answered. The patient agreed with the plan and demonstrated an understanding of the instructions. The patient was advised to call back or seek an in-person evaluation if the symptoms worsen or if the condition fails to improve as anticipated.  Follow up plan:  We change your Zoloft to the 100 mg tablet, only take one of these once daily for anxiety.  It was a pleasure meeting you!   Pleas Koch,  NP

## 2021-03-05 NOTE — Patient Instructions (Signed)
We change your Zoloft to the 100 mg tablet, only take one of these once daily for anxiety.  It was a pleasure meeting you!

## 2021-05-31 ENCOUNTER — Other Ambulatory Visit: Payer: Self-pay | Admitting: Primary Care

## 2021-05-31 DIAGNOSIS — F411 Generalized anxiety disorder: Secondary | ICD-10-CM

## 2021-06-11 ENCOUNTER — Ambulatory Visit: Payer: 59 | Admitting: Dermatology

## 2021-08-05 ENCOUNTER — Ambulatory Visit (INDEPENDENT_AMBULATORY_CARE_PROVIDER_SITE_OTHER): Payer: 59 | Admitting: Nurse Practitioner

## 2021-08-05 ENCOUNTER — Other Ambulatory Visit: Payer: Self-pay

## 2021-08-05 ENCOUNTER — Encounter: Payer: Self-pay | Admitting: Nurse Practitioner

## 2021-08-05 VITALS — BP 138/90 | HR 84 | Temp 98.6°F | Resp 12 | Ht 65.25 in | Wt 174.5 lb

## 2021-08-05 DIAGNOSIS — R519 Headache, unspecified: Secondary | ICD-10-CM

## 2021-08-05 DIAGNOSIS — R030 Elevated blood-pressure reading, without diagnosis of hypertension: Secondary | ICD-10-CM | POA: Diagnosis not present

## 2021-08-05 DIAGNOSIS — F419 Anxiety disorder, unspecified: Secondary | ICD-10-CM

## 2021-08-05 DIAGNOSIS — Z0000 Encounter for general adult medical examination without abnormal findings: Secondary | ICD-10-CM | POA: Diagnosis not present

## 2021-08-05 DIAGNOSIS — R5383 Other fatigue: Secondary | ICD-10-CM

## 2021-08-05 LAB — CBC
HCT: 43.5 % (ref 36.0–46.0)
Hemoglobin: 14.4 g/dL (ref 12.0–15.0)
MCHC: 33 g/dL (ref 30.0–36.0)
MCV: 84.7 fl (ref 78.0–100.0)
Platelets: 277 10*3/uL (ref 150.0–400.0)
RBC: 5.14 Mil/uL — ABNORMAL HIGH (ref 3.87–5.11)
RDW: 13.5 % (ref 11.5–15.5)
WBC: 6.9 10*3/uL (ref 4.0–10.5)

## 2021-08-05 LAB — COMPREHENSIVE METABOLIC PANEL
ALT: 12 U/L (ref 0–35)
AST: 17 U/L (ref 0–37)
Albumin: 4.6 g/dL (ref 3.5–5.2)
Alkaline Phosphatase: 72 U/L (ref 39–117)
BUN: 12 mg/dL (ref 6–23)
CO2: 26 mEq/L (ref 19–32)
Calcium: 9.7 mg/dL (ref 8.4–10.5)
Chloride: 102 mEq/L (ref 96–112)
Creatinine, Ser: 0.71 mg/dL (ref 0.40–1.20)
GFR: 113.7 mL/min (ref >60.00)
Glucose, Bld: 94 mg/dL (ref 70–99)
Potassium: 4.8 mEq/L (ref 3.5–5.1)
Sodium: 137 mEq/L (ref 135–145)
Total Bilirubin: 0.5 mg/dL (ref 0.2–1.2)
Total Protein: 7.6 g/dL (ref 6.0–8.3)

## 2021-08-05 LAB — LIPID PANEL
Cholesterol: 207 mg/dL — ABNORMAL HIGH (ref 0–200)
HDL: 47.3 mg/dL (ref >39.00)
LDL Cholesterol: 133 mg/dL — ABNORMAL HIGH (ref 0–99)
NonHDL: 159.61
Total CHOL/HDL Ratio: 4
Triglycerides: 132 mg/dL (ref 0.0–149.0)
VLDL: 26.4 mg/dL (ref 0.0–40.0)

## 2021-08-05 LAB — HEMOGLOBIN A1C: Hgb A1c MFr Bld: 5.5 % (ref 4.6–6.5)

## 2021-08-05 LAB — TSH: TSH: 1.85 u[IU]/mL (ref 0.35–5.50)

## 2021-08-05 NOTE — Assessment & Plan Note (Signed)
Has history of postpartum hypertension that required medication intervention.  Patient states she has been on nifedipine in the past.  Blood pressures borderline goal today.  Did discuss lifestyle modifications and she will check her blood pressure 3 times weekly sometimes more depending on headaches and send them to me via MyChart before we make a decision of putting her on medication considerations of a beta-blocker to help with headaches and blood pressure.  Continue to monitor weight on home readings.

## 2021-08-05 NOTE — Assessment & Plan Note (Signed)
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  Pending lab results

## 2021-08-05 NOTE — Assessment & Plan Note (Signed)
Frequent headaches several times weekly.  Some of them do become almost migraine-like anywhere from 2 to 4 months.  Patient does take over-the-counter analgesics that helps on occasion do think patient should increase her water intake is and she does not drink much of it and drinks caffeine did discuss about caffeine also raising her blood pressure.  Patient acknowledged she will journal with her headaches and check blood pressures and report back to me via MyChart.

## 2021-08-05 NOTE — Patient Instructions (Signed)
Record your blood pressures at least 3 times a week Try to journal your headaches and see if they are related to your blood pressure If you do this we can schedule you up for a year, sooner if you need me

## 2021-08-05 NOTE — Assessment & Plan Note (Signed)
Currently maintained on Zoloft 100 mg daily.  Patient tolerating medications well.  Patient denies HI/HI/AVH.  Continue sertraline 100 mg daily.

## 2021-08-05 NOTE — Progress Notes (Signed)
Established Patient Office Visit  Subjective:  Patient ID: Sharon Gardner, female    DOB: 06/11/1991  Age: 30 y.o. MRN: [id number]  CC:  Chief Complaint  Patient presents with   Transfer of Care   Discuss blood pressure    Has had issues with high b/p when she was pregnant x 2. After last pregnancy took longer to get b/p under control. Last week b/p reading was 140/94.    HPI [name] presents for complete physical and follow up of chronic conditions.  Immunizations: -Tetanus:02/08/2020 -Influenza: refused -Covid-19: Moderna -Shingles: NA -Pneumonia: NA  -HPV: UTD  Diet: Fair diet. 2-3 meals a day. Limited water intake and does drink frape coffes and soda Exercise: No regular exercise. Walking around work and chasing after children. Walks dog  Eye exam: Completes annually. Contacts. Jan 2022. [facility] Dental exam: Completes semi-annually   Pap Smear: Completed in before 2020. Managed by GYN Mammogram: NA Dexa: NA Colonoscopy: NA   Lung Cancer Screening: NA  Blood pressure: Has been hypertensive post parduem. Does have headaches in the frontal  part of her head and behind her eyes. Does have bad headaches a few times a week. She is able to get them to go away. Does not want to call them migraines but some of the headaches do have nausea associated with it.    Past Medical History:  Diagnosis Date   Allergy    Medical history non-contributory    Pregnancy induced hypertension     Past Surgical History:  Procedure Laterality Date   CESAREAN SECTION N/A 02/11/2017   Procedure: CESAREAN SECTION;  Surgeon: Paula Compton, MD;  Location: [place];  Service: Obstetrics;  Laterality: N/A;   CESAREAN SECTION N/A 04/21/2020   Procedure: CESAREAN SECTION;  Surgeon: Paula Compton, MD;  Location: [place] [facility];  Service: Obstetrics;  Laterality: N/A;  RNFA   TONSILLECTOMY     WISDOM TOOTH EXTRACTION      Family History  Problem Relation Age  of Onset   Cancer Mother    Cancer Paternal Grandmother    4 / Stillbirths Paternal Grandmother     Social History   Socioeconomic History   Marital status: Married    Spouse name: Not on file   Number of children: 1   Years of education: Not on file   Highest education level: Not on file  Occupational History   Occupation: Billing     Comment: [employer]  Tobacco Use   Smoking status: Never   Smokeless tobacco: Never  Vaping Use   Vaping Use: Never used  Substance and Sexual Activity   Alcohol use: No   Drug use: No   Sexual activity: Yes    Birth control/protection: None  Other Topics Concern   Not on file  Social History Narrative   Not on file   Social Determinants of Health   Financial Resource Strain: Not on file  Food Insecurity: Not on file  Transportation Needs: Not on file  Physical Activity: Not on file  Stress: Not on file  Social Connections: Not on file  Intimate Partner Violence: Not on file    Outpatient Medications Prior to Visit  Medication Sig Dispense Refill   sertraline (ZOLOFT) 100 MG tablet TAKE 1 TABLET (100 MG TOTAL) BY MOUTH DAILY. FOR ANXIETY. 90 tablet 0   norethindrone (MICRONOR) 0.35 MG tablet Take 1 tablet by mouth daily.     Prenatal Vit-Fe Fumarate-FA (PRENATAL MULTIVITAMIN) TABS tablet  Take 1 tablet by mouth at bedtime.      No facility-administered medications prior to visit.    Allergies  Allergen Reactions   Amoxicillin Rash    Has patient had a PCN reaction causing immediate rash, facial/tongue/throat swelling, SOB or lightheadedness with hypotension: No Has patient had a PCN reaction causing severe rash involving mucus membranes or skin necrosis: No Has patient had a PCN reaction that required hospitalization No Has patient had a PCN reaction occurring within the last 10 years: Yes If all of the above answers are "NO", then may proceed with Cephalosporin use.     ROS Review of Systems   Constitutional:  Positive for fatigue. Negative for chills and fever.  Eyes:  Negative for visual disturbance.  Respiratory:  Negative for cough and shortness of breath.   Cardiovascular:  Negative for chest pain.  Gastrointestinal:  Negative for constipation, diarrhea, nausea and vomiting.  Genitourinary:  Negative for dysuria and hematuria.  Neurological:  Positive for headaches. Negative for weakness and numbness.  Psychiatric/Behavioral:  Negative for hallucinations and suicidal ideas.      Objective:    Physical Exam Vitals and nursing note reviewed.  Constitutional:      Appearance: Normal appearance.  HENT:     Right Ear: Tympanic membrane, ear canal and external ear normal. There is no impacted cerumen.     Left Ear: Tympanic membrane, ear canal and external ear normal. There is no impacted cerumen.     Mouth/Throat:     Mouth: Mucous membranes are moist.     Pharynx: Oropharynx is clear.  Eyes:     Extraocular Movements: Extraocular movements intact.     Pupils: Pupils are equal, round, and reactive to light.     Comments: Wears corrective lenses   Neck:     Thyroid: No thyroid mass, thyromegaly or thyroid tenderness.  Cardiovascular:     Rate and Rhythm: Normal rate.     Pulses: Normal pulses.  Pulmonary:     Effort: Pulmonary effort is normal.     Breath sounds: Normal breath sounds.  Abdominal:     General: Bowel sounds are normal. There is no distension.     Palpations: There is no mass.     Tenderness: There is no abdominal tenderness.  Musculoskeletal:     Right lower leg: No edema.     Left lower leg: No edema.  Lymphadenopathy:     Cervical: No cervical adenopathy.  Skin:    General: Skin is warm.  Neurological:     General: No focal deficit present.     Mental Status: She is alert.     Motor: No weakness.     Coordination: Coordination normal.     Gait: Gait normal.     Deep Tendon Reflexes: Reflexes normal.     Reflex Scores:      Bicep  reflexes are 2+ on the right side and 2+ on the left side.      Patellar reflexes are 2+ on the right side and 2+ on the left side. Psychiatric:        Mood and Affect: Mood normal.        Behavior: Behavior normal.        Thought Content: Thought content normal.        Judgment: Judgment normal.    BP (!) 140/100   Pulse 84   Temp 98.6 F (37 C)   Resp 12   Ht 5' 5.25" (1.657 m)  Wt 174 lb 8 oz (79.2 kg)   LMP 07/26/2021   SpO2 98%   Breastfeeding No   BMI 28.82 kg/m  Wt Readings from Last 3 Encounters:  08/05/21 174 lb 8 oz (79.2 kg)  03/05/21 153 lb (69.4 kg)  09/01/20 153 lb (69.4 kg)     Health Maintenance Due  Topic Date Due   Hepatitis C Screening  Never done   PAP SMEAR-Modifier  07/31/2019    There are no preventive care reminders to display for this patient.  Lab Results  Component Value Date   TSH 1.76 04/25/2019   Lab Results  Component Value Date   WBC 10.5 04/26/2020   HGB 12.1 04/26/2020   HCT 35.4 (L) 04/26/2020   MCV 87.4 04/26/2020   PLT 226 04/26/2020   Lab Results  Component Value Date   NA 139 04/26/2020   K 4.0 04/26/2020   CO2 22 04/26/2020   GLUCOSE 96 04/26/2020   BUN 14 04/26/2020   CREATININE 0.66 04/26/2020   BILITOT 0.8 04/26/2020   ALKPHOS 76 04/26/2020   AST 15 04/26/2020   ALT 9 04/26/2020   PROT 7.0 04/26/2020   ALBUMIN 3.3 (L) 04/26/2020   CALCIUM 8.9 04/26/2020   ANIONGAP 10 04/26/2020   GFR 93.02 04/25/2019   No results found for: CHOL No results found for: HDL No results found for: LDLCALC No results found for: TRIG No results found for: CHOLHDL No results found for: HGBA1C    Assessment & Plan:   Problem List Items Addressed This Visit       Other   Frequent headaches    Frequent headaches several times weekly.  Some of them do become almost migraine-like anywhere from 2 to 4 months.  Patient does take over-the-counter analgesics that helps on occasion do think patient should increase her water  intake is and she does not drink much of it and drinks caffeine did discuss about caffeine also raising her blood pressure.  Patient acknowledged she will journal with her headaches and check blood pressures and report back to me via MyChart.      Elevated blood pressure reading    Has history of postpartum hypertension that required medication intervention.  Patient states she has been on nifedipine in the past.  Blood pressures borderline goal today.  Did discuss lifestyle modifications and she will check her blood pressure 3 times weekly sometimes more depending on headaches and send them to me via MyChart before we make a decision of putting her on medication considerations of a beta-blocker to help with headaches and blood pressure.  Continue to monitor weight on home readings.      Preventative health care - Primary   Relevant Orders   CBC   Comprehensive metabolic panel   Hemoglobin A1c   TSH   Lipid panel   Anxiety    Currently maintained on Zoloft 100 mg daily.  Patient tolerating medications well.  Patient denies HI/HI/AVH.  Continue sertraline 100 mg daily.      Other fatigue    ResultLikely related to lifestyle being a full-time employee and parent.  Pending lab results       No orders of the defined types were placed in this encounter.   Follow-up: Return in about 1 year (around 08/05/2022) for cpe and labs.   This visit occurred during the SARS-CoV-2 public health emergency.  Safety protocols were in place, including screening questions prior to the visit, additional usage of staff PPE, and extensive  cleaning of exam room while observing appropriate contact time as indicated for disinfecting solutions.   Romilda Garret, NP

## 2021-08-07 ENCOUNTER — Encounter: Payer: Self-pay | Admitting: Obstetrics and Gynecology

## 2021-08-26 ENCOUNTER — Other Ambulatory Visit: Payer: Self-pay | Admitting: Primary Care

## 2021-08-26 DIAGNOSIS — F411 Generalized anxiety disorder: Secondary | ICD-10-CM

## 2021-12-21 ENCOUNTER — Other Ambulatory Visit: Payer: Self-pay

## 2021-12-21 DIAGNOSIS — F411 Generalized anxiety disorder: Secondary | ICD-10-CM

## 2021-12-21 MED ORDER — SERTRALINE HCL 100 MG PO TABS: 100.0000 mg | ORAL_TABLET | Freq: Every day | ORAL | 1 refills | Status: DC

## 2022-01-06 ENCOUNTER — Ambulatory Visit: Payer: 59 | Admitting: Nurse Practitioner

## 2022-01-26 IMAGING — CR DG CHEST 2V
1 series · 2 of 2 positions shown · non-contrast
Comparison: None.

CLINICAL DATA: Elevated blood pressure, recent delivery

EXAM:
CHEST - 2 VIEW

[Series 1: dg chest 2 view · 0.14mm/px · 2 of 2 slices shown]
[im 1/2]
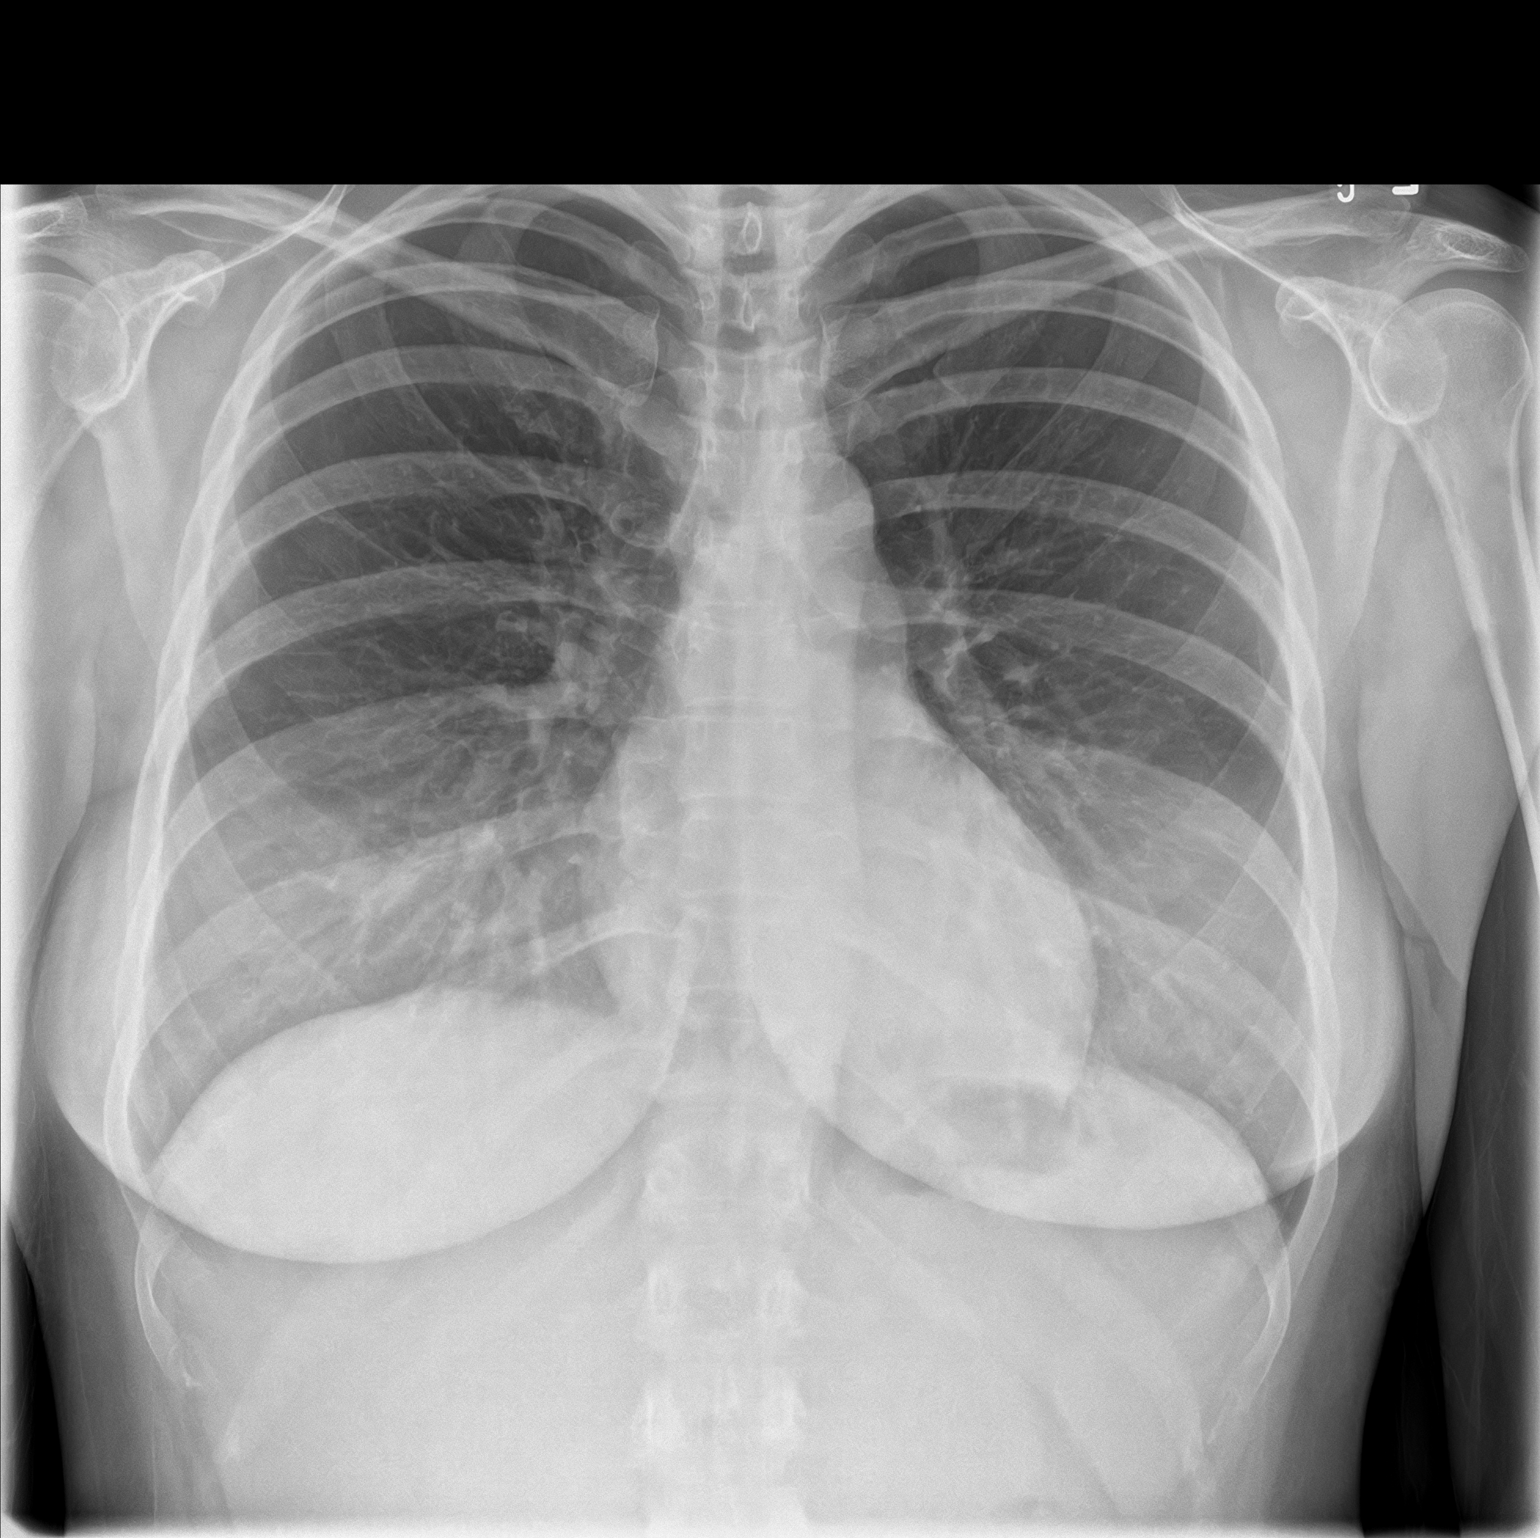
[im 2/2]
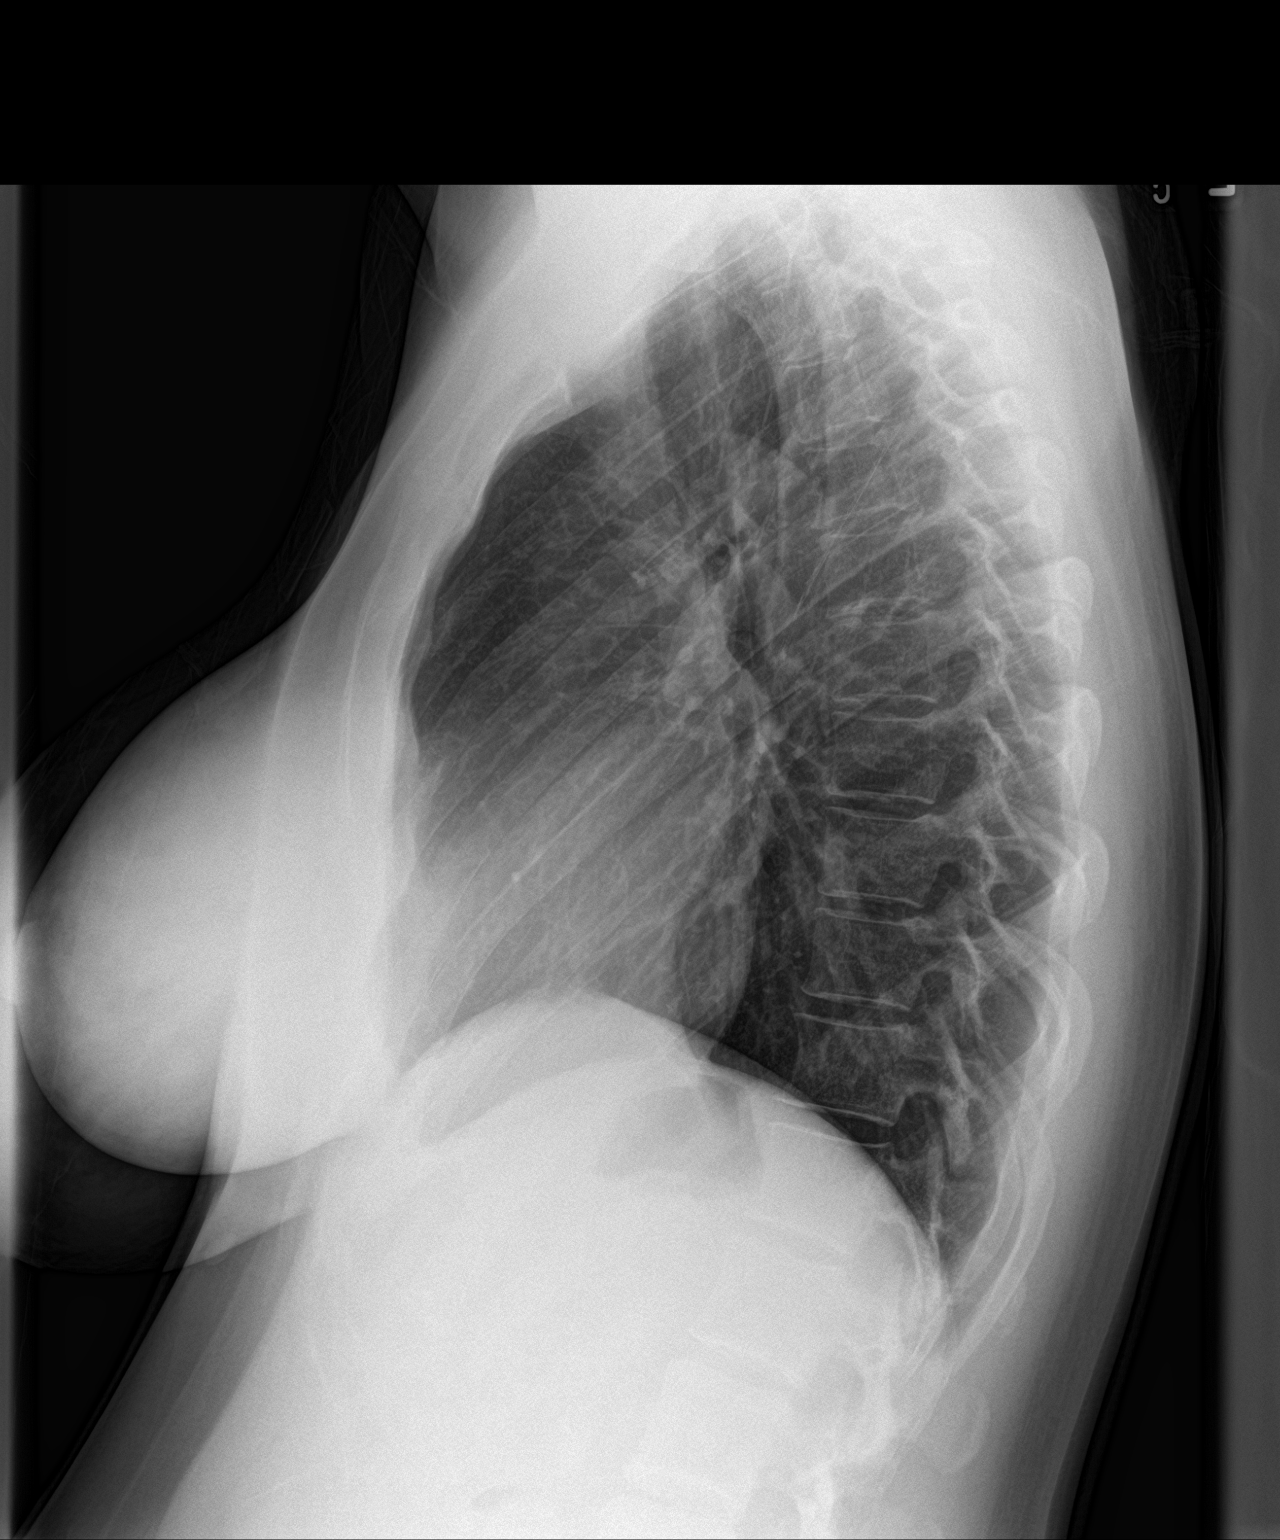

[2 of 2 positions shown; findings below may reference images not displayed]

FINDINGS: The heart size and mediastinal contours are within normal limits.
Both lungs are clear. The visualized skeletal structures are
unremarkable.
IMPRESSION: No active cardiopulmonary disease.

## 2022-05-04 ENCOUNTER — Other Ambulatory Visit: Payer: Self-pay | Admitting: Nurse Practitioner

## 2022-05-04 DIAGNOSIS — F411 Generalized anxiety disorder: Secondary | ICD-10-CM

## 2022-08-20 DEATH — deceased

## 2022-09-03 ENCOUNTER — Other Ambulatory Visit: Payer: Self-pay | Admitting: Nurse Practitioner

## 2022-09-03 DIAGNOSIS — F411 Generalized anxiety disorder: Secondary | ICD-10-CM

## 2022-09-07 NOTE — Telephone Encounter (Signed)
Please call patient and schedule CPE as instructed.. Send back for refill after appointment is scheduled.

## 2022-09-07 NOTE — Telephone Encounter (Signed)
Lvmtcb, sent mychart message  

## 2022-09-09 NOTE — Telephone Encounter (Signed)
LVM for patient to call back and schedule

## 2022-09-09 NOTE — Telephone Encounter (Signed)
Unable to reach patient. Left voicemail to return call to our office.   

## 2022-09-15 ENCOUNTER — Encounter: Payer: Self-pay | Admitting: Nurse Practitioner

## 2022-09-15 NOTE — Telephone Encounter (Unsigned)
We have called patient x 3 left message no call back I am sending a my chart and letter to call office today.

## 2022-09-16 ENCOUNTER — Other Ambulatory Visit: Payer: Self-pay | Admitting: Nurse Practitioner

## 2022-09-16 DIAGNOSIS — F411 Generalized anxiety disorder: Secondary | ICD-10-CM

## 2022-12-14 ENCOUNTER — Other Ambulatory Visit: Payer: Self-pay | Admitting: Nurse Practitioner

## 2022-12-14 DIAGNOSIS — F411 Generalized anxiety disorder: Secondary | ICD-10-CM

## 2023-01-06 ENCOUNTER — Encounter: Payer: 59 | Admitting: Nurse Practitioner

## 2023-05-08 ENCOUNTER — Other Ambulatory Visit: Payer: Self-pay | Admitting: Nurse Practitioner

## 2023-05-08 DIAGNOSIS — F411 Generalized anxiety disorder: Secondary | ICD-10-CM

## 2023-07-12 ENCOUNTER — Ambulatory Visit: Payer: Self-pay | Admitting: Internal Medicine

## 2023-10-27 NOTE — Progress Notes (Signed)
 GYNECOLOGY: NEW PATIENT  Subjective:    PCP: Dorothe Gaster, NP NARAYAH PULEO is a 33 y.o. female [phone] who presents as a self-referral for PCOS and no period since July 2024. She is desiring pregnancy and is interested in starting fertility medications.   Menarche at 33yo, periods regular monthly, lasting between 3-6 days. Then in 2016-2017, she started skipping. Diagnosed with PCOS at that time with outside GYN and was put on Metformin, took 500mg  BID. Her periods resumed and she naturally conceived without OPKs or medications, discontinued the Metformin 3mos into her pregnancy, delivered 02/11/17. She did this regimen again and had a second child 04/21/20. In between, she was breastfeeding and used Nuvaring, periods were irregular due to breastfeeding. She went for eval in Sep '24, was ready for next pregnancy, took Metformin 500mg  BID from Sep - Jan '25, and periods did not resume and she did not conceive. She has not taken contraception for the past 12 mos. Does not desire to do Metformin again. Outside GYN did bloodwork and an ultrasound that were reassuring and c/w PCOS.   Recent Period Hx: 1/18-1/22/24 3/29-4/23/24 5/3-01/24/23 6/13-6/16/24 7/22-04/28/23  GYN HISTORY:  Patient's last menstrual period was 03/21/2023.     Menstrual History: OB History     Gravida  3   Para  2   Term  2   Preterm      AB  1   Living  2      SAB  1   IAB      Ectopic      Multiple  0   Live Births  2           Menarche age: 41 Patient's last menstrual period was 03/21/2023. Period Duration (Days): 14 Period Pattern: (!) Irregular Menstrual Flow: Light Menstrual Control: Tampon Dysmenorrhea: None   Intermenstrual bleeding, spotting, or discharge? no Urinary incontinence? no  Sexually active: yes Number of sexual partners: yes Gender of sexual Partners: male Social History   Substance and Sexual Activity  Sexual Activity Yes   Birth control/protection: None    Contraceptive methods: no method Dyspareunia? no STI history: no STI/HIV testing or immunizations needed? No.   Social History   Tobacco Use   Smoking status: Never   Smokeless tobacco: Never  Substance Use Topics   Alcohol use: No   Occupation: medical billing     Lives with: husband and kids   _________________________________________________________  Current Outpatient Medications  Medication Sig Dispense Refill   letrozole  (FEMARA ) 2.5 MG tablet Take 1 tablet (2.5 mg total) by mouth daily for 5 days. 5 tablet 3   medroxyPROGESTERone  (PROVERA ) 10 MG tablet Take 1 tablet (10 mg total) by mouth daily. Take for 10 days to induce menstrual period. 30 tablet 0   sertraline  (ZOLOFT ) 100 MG tablet Take 1 tablet by mouth daily. Please schedule an office visit for further refills. 90 tablet 1   No current facility-administered medications for this visit.   Allergies  Allergen Reactions   Amoxicillin Rash    Has patient had a PCN reaction causing immediate rash, facial/tongue/throat swelling, SOB or lightheadedness with hypotension: No Has patient had a PCN reaction causing severe rash involving mucus membranes or skin necrosis: No Has patient had a PCN reaction that required hospitalization No Has patient had a PCN reaction occurring within the last 10 years: Yes If all of the above answers are "NO", then may proceed with Cephalosporin use.     Past Medical  History:  Diagnosis Date   Allergy    Medical history non-contributory    Pregnancy induced hypertension    Past Surgical History:  Procedure Laterality Date   CESAREAN SECTION N/A 02/11/2017   Procedure: CESAREAN SECTION;  Surgeon: Rogene Claude, MD;  Location: [facility] BIRTHING SUITES;  Service: Obstetrics;  Laterality: N/A;   CESAREAN SECTION N/A 04/21/2020   Procedure: CESAREAN SECTION;  Surgeon: Rogene Claude, MD;  Location: [facility] LD ORS;  Service: Obstetrics;  Laterality: N/A;  RNFA   TONSILLECTOMY     WISDOM TOOTH  EXTRACTION      Review Of Systems  Constitutional: Denied constitutional symptoms, night sweats, recent illness, fatigue, fever, insomnia and weight loss.  Eyes: Denied eye symptoms, eye pain, photophobia, vision change and visual disturbance.  Ears/Nose/Throat/Neck: Denied ear, nose, throat or neck symptoms, hearing loss, nasal discharge, sinus congestion and sore throat.  Cardiovascular: Denied cardiovascular symptoms, arrhythmia, chest pain/pressure, edema, exercise intolerance, orthopnea and palpitations.  Respiratory: Denied pulmonary symptoms, asthma, pleuritic pain, productive sputum, cough, dyspnea and wheezing.  Gastrointestinal: Denied, gastro-esophageal reflux, melena, nausea and vomiting.  Genitourinary: Irregular periods  Musculoskeletal: Denied musculoskeletal symptoms, stiffness, swelling, muscle weakness and myalgia.  Dermatologic: Denied dermatology symptoms, rash and scar.  Neurologic: Denied neurology symptoms, dizziness, headache, neck pain and syncope.  Psychiatric: Denied psychiatric symptoms, anxiety and depression.  Endocrine: Denied endocrine symptoms including hot flashes and night sweats.      Objective:    BP (!) 142/89   Pulse 94   Ht 5\' 4"  (1.626 m)   Wt 182 lb (82.6 kg)   LMP 03/21/2023   BMI 31.24 kg/m   Physical Exam Vitals and nursing note reviewed.  Constitutional:      Appearance: Normal appearance. She is obese.  HENT:     Head: Normocephalic and atraumatic.  Eyes:     Extraocular Movements: Extraocular movements intact.  Pulmonary:     Effort: Pulmonary effort is normal.  Musculoskeletal:        General: Normal range of motion.  Neurological:     General: No focal deficit present.     Mental Status: She is alert.  Psychiatric:        Mood and Affect: Mood normal.    Assessment/Plan:    SYRINA LONSDALE is a 33 y.o. female [phone] with PCOS desiring pregnancy, with oligomenorrhea. Discussed starting medical ovulation induction  and pt would prefer to try OI now. -Rx for Letrozole  2.5mg  daily x 5d -- SEs and dosing reviewed. -Schedule for OI Protocol given for the next 3 mos.  -Discussion re: semen analysis and HSG, decision made to hold off for now, as we suspect anovulation as the culprit. Discussed Femara  vs Clomid, aware is not FDA approved for OI, pt is amenable. -Continue PNV daily, ensure adequate hydration, healthy nutrition, regular physical activity, and keep caffeine intake to < 250mg /day.  Elevated BP: 142/89 today in office and asymptomatic. Hx of gHTN in both pregnancies. Recommend she establish with PCP for BP eval, we discussed possibility she has essential HTN now.   -RTC in 3 mos for f/u in not pregnant prior.     Total time was 45 minutes. That includes chart review before the visit, the actual patient visit, and time spent on documentation after the visit. Time excludes procedures, if any.    Return in about 3 months (around 01/28/2024).    Sofia Dunn, DO [facility]

## 2023-10-31 ENCOUNTER — Ambulatory Visit (INDEPENDENT_AMBULATORY_CARE_PROVIDER_SITE_OTHER): Payer: BC Managed Care – PPO | Admitting: Obstetrics

## 2023-10-31 ENCOUNTER — Encounter: Payer: Self-pay | Admitting: Obstetrics

## 2023-10-31 VITALS — BP 142/89 | HR 94 | Ht 64.0 in | Wt 182.0 lb

## 2023-10-31 DIAGNOSIS — R030 Elevated blood-pressure reading, without diagnosis of hypertension: Secondary | ICD-10-CM | POA: Diagnosis not present

## 2023-10-31 DIAGNOSIS — N915 Oligomenorrhea, unspecified: Secondary | ICD-10-CM | POA: Insufficient documentation

## 2023-10-31 DIAGNOSIS — E282 Polycystic ovarian syndrome: Secondary | ICD-10-CM | POA: Diagnosis not present

## 2023-10-31 MED ORDER — LETROZOLE 2.5 MG PO TABS: 2.5000 mg | ORAL_TABLET | Freq: Every day | ORAL | 3 refills | Status: AC

## 2023-10-31 MED ORDER — MEDROXYPROGESTERONE ACETATE 10 MG PO TABS: 10.0000 mg | ORAL_TABLET | Freq: Every day | ORAL | 0 refills | Status: DC

## 2023-10-31 NOTE — Patient Instructions (Addendum)
 FEMARA  (LETROZOLE )  PATIENT INSTRUCTIONS  WHY USE IT? Clomid helps your ovaries to release eggs (ovulate).  HOW TO USE IT? Letrozole  is taken as a pill on days 3,4,5,6 & 7 of your cycle.  Day 1 is the first day of your period. The dose or duration may be changed to achieve ovulation.  Provera  (progesterone) may first be used to bring on a period for some patients.  If you do not get a period, take Provera  10 mg daily for 10 days to bring on a period; the first day you get bleeding is Day 1 of your cycle.

## 2023-12-26 DIAGNOSIS — J019 Acute sinusitis, unspecified: Secondary | ICD-10-CM | POA: Diagnosis not present

## 2023-12-26 DIAGNOSIS — F419 Anxiety disorder, unspecified: Secondary | ICD-10-CM | POA: Diagnosis not present

## 2024-01-04 ENCOUNTER — Ambulatory Visit

## 2024-01-05 ENCOUNTER — Encounter: Payer: Self-pay | Admitting: Obstetrics

## 2024-01-20 ENCOUNTER — Other Ambulatory Visit: Payer: Self-pay | Admitting: Obstetrics

## 2024-01-20 DIAGNOSIS — E282 Polycystic ovarian syndrome: Secondary | ICD-10-CM

## 2024-01-20 DIAGNOSIS — N915 Oligomenorrhea, unspecified: Secondary | ICD-10-CM

## 2024-01-24 ENCOUNTER — Telehealth (INDEPENDENT_AMBULATORY_CARE_PROVIDER_SITE_OTHER): Admitting: Obstetrics

## 2024-01-24 DIAGNOSIS — N915 Oligomenorrhea, unspecified: Secondary | ICD-10-CM

## 2024-01-24 DIAGNOSIS — E282 Polycystic ovarian syndrome: Secondary | ICD-10-CM

## 2024-01-24 MED ORDER — MEDROXYPROGESTERONE ACETATE 10 MG PO TABS: 10.0000 mg | ORAL_TABLET | Freq: Every day | ORAL | 0 refills | Status: DC

## 2024-01-24 NOTE — Progress Notes (Signed)
   Virtual Visit via Video Note  I connected with Sharon Gardner on 01/24/24 at  4:15 PM EDT by a video-enabled telemedicine application and verified that I am speaking with the correct person using two identifiers.  Location: Patient: at home Provider: [facility]   The limitations of evaluation and management by telemedicine and the availability of in person appointments was discussed. The patient expressed understanding and agreed to proceed.    History of Present Illness:   Sharon Gardner is a 33 y.o. [id number] female who presents for follow up on ovulation induction, last seen 10/31/23 with PCOS and oligomenorrhea, started on Provera  for period-induction and Femara  for OI.   First cycle:  Period: 11/13/23 Ovulation: unsure Femara : yes, 2.5mg   Then skipped her period in March, used Provera  starting 12/17/23 and LMP 01/07/24. She did OPKs that cycle and had LH surge 5/2-5/3 (days 14-15); had intercourse 4/28 and 5/2. She is currently on day 18 and waiting to pregnancy test and is hopeful. Otherwise no issues. Still has 1 round of the each medication.    Observations/Objective:   There were no vitals taken for this visit. Gen App: NAD Psych: normal speech, affect. Good mood.    Assessment and Plan:   33 y.o. [id number] with PCOS and oligomenorrhea, desiring pregnancy, now s/p 2 cycles of OI with Femara  2.5mg , currently on day 18 of cycle with positive ovulation, IC on days 10 and 14, awaiting to take pregnancy test.  -If pregnant, notify us  to begin prenatal care -If not pregnant, reset with Provera  and use 3rd (final) course of Femara  - Provera  refilled today x 3 mos -Pt to contact us  with updates. Will plan to increase Femara  dose if needing further rounds.    Follow Up Instructions:    I discussed the assessment and treatment plan with the patient. The patient was provided an opportunity to ask questions and all were answered. The patient agreed with the plan and demonstrated  an understanding of the instructions.   The patient was advised to call back or seek an in-person evaluation if the symptoms worsen or if the condition fails to improve as anticipated.   I provided 5 minutes of non-face-to-face time during this encounter.     Sofia Dunn, DO  OB/GYN

## 2024-02-02 ENCOUNTER — Encounter: Payer: Self-pay | Admitting: Obstetrics

## 2024-02-07 NOTE — Progress Notes (Signed)
    NURSE VISIT NOTE  Subjective:    Patient ID: Sharon Gardner, female    DOB: 07-12-1991, 33 y.o.   MRN: [id number]  HPI  Patient is a 33 y.o. [id number] female who presents for evaluation of amenorrhea. She believes she could be pregnant. Pregnancy is desired. Sexual Activity: single partner, contraception: none. Current symptoms also include: fatigue and positive home pregnancy test. Last period was normal.    Objective:    BP 126/80   Pulse 87   Ht 5\' 4"  (1.626 m)   Wt 175 lb (79.4 kg)   LMP 01/07/2024 (Exact Date)   BMI 30.04 kg/m   Lab Review  Results for orders placed or performed in visit on 02/08/24  POCT urine pregnancy  Result Value Ref Range   Preg Test, Ur Positive (A) Negative    Assessment:   1. Absence of menstruation   2. Encounter for antenatal screening for uncertain dates     Plan:   Pregnancy Test: Positive  Estimated Date of Delivery: 10/13/2024 BP Cuff Measurement taken. Cuff Size Adult Small Encouraged well-balanced diet, plenty of rest when needed, pre-natal vitamins daily and walking for exercise.  Discussed self-help for nausea, avoiding OTC medications until consulting provider or pharmacist, other than Tylenol  as needed, minimal caffeine (1-2 cups daily) and avoiding alcohol.   She will schedule her nurse visit @ 7-[redacted] wks pregnant, u/s for dating @10  wk, and NOB visit at [redacted] wk pregnant.    Feel free to call with any questions.  Patient will check my chart for results.   Inga Manges, CMA

## 2024-02-08 ENCOUNTER — Ambulatory Visit (INDEPENDENT_AMBULATORY_CARE_PROVIDER_SITE_OTHER)

## 2024-02-08 VITALS — BP 126/80 | HR 87 | Ht 64.0 in | Wt 175.0 lb

## 2024-02-08 DIAGNOSIS — Z3687 Encounter for antenatal screening for uncertain dates: Secondary | ICD-10-CM

## 2024-02-08 DIAGNOSIS — Z3201 Encounter for pregnancy test, result positive: Secondary | ICD-10-CM | POA: Diagnosis not present

## 2024-02-08 DIAGNOSIS — N912 Amenorrhea, unspecified: Secondary | ICD-10-CM

## 2024-02-08 LAB — POCT URINE PREGNANCY: Preg Test, Ur: POSITIVE — AB

## 2024-02-08 NOTE — Patient Instructions (Signed)
 CONGRATULATIONS Sharon Gardner!!   First Trimester of Pregnancy  The first trimester of pregnancy starts on the first day of your last monthly period until the end of week 13. This is months 1 through 3 of pregnancy. A week after a sperm fertilizes an egg, the egg will implant into the wall of the uterus and begin to develop into a baby. Body changes during your first trimester Your body goes through many changes during pregnancy. The changes usually return to normal after your baby is born. Physical changes Your breasts may grow larger and may hurt. The area around your nipples may get darker. Your periods will stop. Your hair and nails may grow faster. You may pee more often. Health changes You may tire easily. Your gums may bleed and may be sensitive when you brush and floss. You may not feel hungry. You may have heartburn. You may throw up or feel like you may throw up. You may want to eat some foods, but not others. You may have headaches. You may have trouble pooping (constipation). Other changes Your emotions may change from day to day. You may have more dreams. Follow these instructions at home: Medicines Talk to your health care provider if you're taking medicines. Ask if the medicines are safe to take during pregnancy. Your provider may change the medicines that you take. Do not take any medicines unless told to by your provider. Take a prenatal vitamin that has at least 600 micrograms (mcg) of folic acid. Do not use herbal medicines, illegal substances, or medicines that are not approved by your provider. Eating and drinking While you're pregnant your body needs extra food for your growing baby. Talk with your provider about what to eat while pregnant. Activity Most women are able to exercise during pregnancy. Exercises may need to change as your pregnancy goes on. Talk to your provider about your activities and exercise routines. Relieving pain and discomfort Wear a good,  supportive bra if your breasts hurt. Rest with your legs raised if you have leg cramps or low back pain. Safety Wear your seatbelt at all times when you're in a car. Talk to your provider if someone hits you, hurts you, or yells at you. Talk with your provider if you're feeling sad or have thoughts of hurting yourself. Lifestyle Certain things can be harmful while you're pregnant. Follow these rules: Do not use hot tubs, steam rooms, or saunas. Do not douche. Do not use tampons or scented pads. Do not drink alcohol,smoke, vape, or use products with nicotine or tobacco in them. If you need help quitting, talk with your provider. Avoid cat litter boxes and soil used by cats. These things carry germs that can cause harm to your pregnancy and your baby. General instructions Keep all follow-up visits. It helps you and your unborn baby stay as healthy as possible. Write down your questions. Take them to your visits. Your provider will: Talk with you about your overall health. Give you advice or refer you to specialists who can help with different needs, including: Prenatal education classes. Mental health and counseling. Foods and healthy eating. Ask for help if you need help with food. Call your dentist and ask to be seen. Brush your teeth with a soft toothbrush. Floss gently. Where to find more information American Pregnancy Association: americanpregnancy.org [employer]: acog.org Office on [employer] Health: [employer] Contact a health care provider if: You feel dizzy, faint, or have a fever. You vomit or have watery  poop (diarrhea) for 2 days or more. You have abnormal discharge or bleeding from your vagina. You have pain when you pee or your pee smells bad. You have cramps, pain, or pressure in your belly area. Get help right away if: You have trouble breathing or chest pain. You have any kind of injury, such as from a fall or a car  crash. These symptoms may be an emergency. Get help right away. Call 911. Do not wait to see if the symptoms will go away. Do not drive yourself to the hospital. This information is not intended to replace advice given to you by your health care provider. Make sure you discuss any questions you have with your health care provider. Document Revised: 06/09/2023 Document Reviewed: 01/07/2023 Elsevier Patient Education  2024 [employer].

## 2024-02-27 ENCOUNTER — Telehealth (INDEPENDENT_AMBULATORY_CARE_PROVIDER_SITE_OTHER)

## 2024-02-27 DIAGNOSIS — Z3481 Encounter for supervision of other normal pregnancy, first trimester: Secondary | ICD-10-CM | POA: Diagnosis not present

## 2024-02-27 DIAGNOSIS — O0990 Supervision of high risk pregnancy, unspecified, unspecified trimester: Secondary | ICD-10-CM | POA: Insufficient documentation

## 2024-02-27 DIAGNOSIS — Z3A00 Weeks of gestation of pregnancy not specified: Secondary | ICD-10-CM | POA: Diagnosis not present

## 2024-02-27 DIAGNOSIS — Z3480 Encounter for supervision of other normal pregnancy, unspecified trimester: Secondary | ICD-10-CM | POA: Insufficient documentation

## 2024-02-27 NOTE — Progress Notes (Addendum)
 New OB Intake  I connected with  Sharon Gardner on 02/27/24 at  8:15 AM EDT by MyChart Video Visit and verified that I am speaking with the correct person using two identifiers. Nurse is located at [facility] and pt is located in her car.  I discussed the limitations, risks, security and privacy concerns of performing an evaluation and management service by telephone and the availability of in person appointments. I also discussed with the patient that there may be a patient responsible charge related to this service. The patient expressed understanding and agreed to proceed.  I explained I am completing New OB Intake today. We discussed her EDD of 10/13/2024 that is based on LMP of 01/07/2024. Pt is G4/P2012. I reviewed her allergies, medications, Medical/Surgical/OB history, and appropriate screenings. There are cats in the home: yes. If yes: Indoor. Based on history, this is a/an pregnancy uncomplicated . Her obstetrical history is significant for pregnancy induced hypertension.  Patient Active Problem List   Diagnosis Date Noted   Anxiety 08/05/2021   Other fatigue 08/05/2021   GAD (generalized anxiety disorder) 03/05/2021   Depressive disorder 09/01/2020   S/P repeat low transverse C-section 04/21/2020   PCOS (polycystic ovarian syndrome) 03/25/2020   Frequent headaches 08/16/2017   S/P primary low transverse C-section 02/11/2017   History of gestational hypertension 02/10/2017    Concerns addressed today: Discussed round ligament pain and ectopic precautions.  Delivery Plans:  Plans to deliver at [facility].  Anatomy US  Explained first scheduled US  will be 03/19/24. Anatomy US  will be scheduled around [redacted] weeks gestational age.  Labs Discussed genetic screening with patient. Patient consents to genetic testing to be drawn at new OB visit. Discussed possible labs to be drawn at new OB appointment.  COVID Vaccine Patient has had COVID vaccine.   Social  Determinants of Health Food Insecurity: denies food insecurity WIC Referral: Patient is not interested in referral to [facility].  Transportation: Patient denies transportation needs. Childcare: Discussed no children allowed at ultrasound appointments.   First visit review I reviewed new OB appt with pt. I explained she will have blood work and pap smear/pelvic exam if indicated. Explained pt will be seen by Sofia Dunn MD at first visit; encounter routed to appropriate provider.   Juanita Norlander, RN 02/27/2024  8:42 AM

## 2024-02-27 NOTE — Patient Instructions (Signed)

## 2024-03-01 ENCOUNTER — Ambulatory Visit: Admitting: Nurse Practitioner

## 2024-03-02 ENCOUNTER — Encounter: Payer: Self-pay | Admitting: Obstetrics

## 2024-03-02 ENCOUNTER — Emergency Department: Admission: EM | Admit: 2024-03-02 | Source: Home / Self Care

## 2024-03-19 ENCOUNTER — Other Ambulatory Visit: Payer: Self-pay | Admitting: Obstetrics

## 2024-03-19 ENCOUNTER — Ambulatory Visit
Admission: RE | Admit: 2024-03-19 | Discharge: 2024-03-19 | Disposition: A | Discharge status: Home / Self Care | Source: Ambulatory Visit | Attending: Obstetrics | Admitting: Obstetrics

## 2024-03-19 DIAGNOSIS — Z3687 Encounter for antenatal screening for uncertain dates: Secondary | ICD-10-CM | POA: Insufficient documentation

## 2024-03-19 DIAGNOSIS — N912 Amenorrhea, unspecified: Secondary | ICD-10-CM

## 2024-04-02 NOTE — Progress Notes (Unsigned)
 OBSTETRIC INITIAL PRENATAL VISIT  Subjective:    Sharon Gardner is being seen today for her first obstetrical visit.  This is a planned pregnancy. She is a 33 y.o. [id number] female at [redacted]w[redacted]d gestation, Estimated Date of Delivery: 10/13/24 with Patient's last menstrual period was 01/07/2024 (exact date).,  consistent with 10 week sono. Her obstetrical history is significant for pregnancy induced hypertension. Relationship with FOB: spouse, living together. Patient does intend to breast feed. Pregnancy history fully reviewed.    OB History  Gravida Para Term Preterm AB Living  4 2 2  0 1 2  SAB IAB Ectopic Multiple Live Births  1 0 0 0 2    # Outcome Date GA Lbr Len/2nd Weight Sex Type Anes PTL Lv  4 Current           3 Term 04/21/20 [redacted]w[redacted]d  5 lb 15.2 oz (2.7 kg) F CS-LTranv Spinal  LIV     Name: RYLIEGH, MCDUFFEY     Apgar1: 8  Apgar5: 9  2 Term 02/11/17 [redacted]w[redacted]d  7 lb 1.9 oz (3.23 kg) F CS-LTranv EPI  LIV     Birth Comments: preterm 37 weeks, thick vernix     Name: TRINISHA, PAGET     Apgar1: 10  Apgar5: 10  1 SAB 2017            Gynecologic History:  Last pap smear was 06/02/2023.  Results were Normal.  denies h/o abnormal pap smears in the past.  denies history of STIs.  Contraception prior to conception: None   Past Medical History:  Diagnosis Date   Allergy    Dysfunctional uterine bleeding 04/25/2019   Gestational (pregnancy-induced) hypertension without significant proteinuria, third trimester 04/21/2020   Medical history non-contributory    Pregnancy induced hypertension     Family History  Problem Relation Age of Onset   Cancer Mother 31       pancreatic   Cancer Paternal Grandmother        breast.   Miscarriages / Stillbirths Paternal Grandmother     Past Surgical History:  Procedure Laterality Date   CESAREAN SECTION N/A 02/11/2017   Procedure: CESAREAN SECTION;  Surgeon: Estelle Service, MD;  Location: [facility] BIRTHING SUITES;  Service: Obstetrics;   Laterality: N/A;   CESAREAN SECTION N/A 04/21/2020   Procedure: CESAREAN SECTION;  Surgeon: Estelle Service, MD;  Location: [facility] LD ORS;  Service: Obstetrics;  Laterality: N/A;  RNFA   TONSILLECTOMY     WISDOM TOOTH EXTRACTION      Social History   Socioeconomic History   Marital status: Married    Spouse name: [place]   Number of children: 3   Years of education: Not on file   Highest education level: Not on file  Occupational History   Occupation: Billing     Comment: [employer]   Occupation: Building services engineer  Tobacco Use   Smoking status: Never   Smokeless tobacco: Never  Vaping Use   Vaping status: Never Used  Substance and Sexual Activity   Alcohol use: No   Drug use: No   Sexual activity: Yes    Birth control/protection: None  Other Topics Concern   Not on file  Social History Narrative   Not on file   Social Drivers of Health   Financial Resource Strain: Not on file  Food Insecurity: No Food Insecurity (02/27/2024)   Hunger Vital Sign    Worried About Running Out of Food in the Last Year: Never true  Ran Out of Food in the Last Year: Never true  Transportation Needs: No Transportation Needs (02/27/2024)   PRAPARE - Administrator, Civil Service (Medical): No    Lack of Transportation (Non-Medical): No  Physical Activity: Insufficiently Active (02/27/2024)   Exercise Vital Sign    Days of Exercise per Week: 3 days    Minutes of Exercise per Session: 30 min  Stress: Not on file  Social Connections: Not on file  Intimate Partner Violence: Not At Risk (02/27/2024)   Humiliation, Afraid, Rape, and Kick questionnaire    Fear of Current or Ex-Partner: No    Emotionally Abused: No    Physically Abused: No    Sexually Abused: No    Current Outpatient Medications on File Prior to Visit  Medication Sig Dispense Refill   Prenatal Vit-Fe Fumarate-FA (PRENATAL PO) Take by mouth.     No current facility-administered medications on file prior to  visit.    Allergies  Allergen Reactions   Amoxicillin Rash and Dermatitis    Has patient had a PCN reaction causing immediate rash, facial/tongue/throat swelling, SOB or lightheadedness with hypotension: No  Has patient had a PCN reaction causing severe rash involving mucus membranes or skin necrosis: No  Has patient had a PCN reaction that required hospitalization No  Has patient had a PCN reaction occurring within the last 10 years: Yes  If all of the above answers are NO, then may proceed with Cephalosporin use.  Has patient had a PCN reaction causing immediate rash, facial/tongue/throat swelling, SOB or lightheadedness with hypotension: No Has patient had a PCN reaction causing severe rash involving mucus membranes or skin necrosis: No Has patient had a PCN reaction that required hospitalization No Has patient had a PCN reaction occurring within the last 10 years: Yes If all of the above answers are NO, then may proceed with Cephalosporin use.     Review of Systems General: Not Present- Fever, Weight Loss and Weight Gain. Skin: Not Present- Rash. HEENT: Not Present- Blurred Vision, Headache and Bleeding Gums. Respiratory: Not Present- Difficulty Breathing. Breast: Not Present- Breast Mass. Cardiovascular: Not Present- Chest Pain, Elevated Blood Pressure, Fainting / Blacking Out and Shortness of Breath. Gastrointestinal: Not Present- Abdominal Pain, Constipation, Nausea and Vomiting. Female Genitourinary: Not Present- Frequency, Painful Urination, Pelvic Pain, Vaginal Bleeding, Vaginal Discharge, Contractions, regular, Fetal Movements Decreased, Urinary Complaints and Vaginal Fluid. Musculoskeletal: Not Present- Back Pain and Leg Cramps. Neurological: Not Present- Dizziness. Psychiatric: Not Present- Depression.     Objective:   Last menstrual period 01/07/2024.  There is no height or weight on file to calculate BMI.  General Appearance:    Alert, cooperative, no  distress, appears stated age  Head:    Normocephalic, without obvious abnormality, atraumatic  Eyes:    PERRL, conjunctiva/corneas clear, EOM's intact, both eyes  Ears:    Normal external ear canals, both ears  Nose:   Nares normal, septum midline, mucosa normal, no drainage or sinus tenderness  Throat:   Lips, mucosa, and tongue normal; teeth and gums normal  Neck:   Supple, symmetrical, trachea midline, no adenopathy; thyroid : no enlargement/tenderness/nodules; no carotid bruit or JVD  Back:     Symmetric, no curvature, ROM normal, no CVA tenderness  Lungs:     Clear to auscultation bilaterally, respirations unlabored  Chest Wall:    No tenderness or deformity   Heart:    Regular rate and rhythm, S1 and S2 normal, no murmur, rub or gallop  Breast  Exam:    No tenderness, masses, or nipple abnormality  Abdomen:     Soft, non-tender, bowel sounds active all four quadrants, no masses, no organomegaly.  FHT ***  bpm.  Genitalia:    Pelvic:external genitalia normal, vagina without lesions, discharge, or tenderness, rectovaginal septum  normal. Cervix normal in appearance, no cervical motion tenderness, no adnexal masses or tenderness.  Pregnancy positive findings: uterine enlargement: *** wk size, nontender.   Rectal:    Normal external sphincter.  No hemorrhoids appreciated. Internal exam not done.   Extremities:   Extremities normal, atraumatic, no cyanosis or edema  Pulses:   2+ and symmetric all extremities  Skin:   Skin color, texture, turgor normal, no rashes or lesions  Lymph nodes:   Cervical, supraclavicular, and axillary nodes normal  Neurologic:   CNII-XII intact, normal strength, sensation and reflexes throughout     Assessment:   No diagnosis found.  Plan:   Supervision of ***normal/high risk pregnancy  - Initial labs reviewed. - Prenatal vitamins encouraged. - Problem list reviewed and updated. - New OB counseling:  The patient has been given an overview regarding routine  prenatal care.  Recommendations regarding diet, weight gain, and exercise in pregnancy were given. - Prenatal testing, optional genetic testing, and ultrasound use in pregnancy were reviewed.  Traditional genetic screening vs cell-fee DNA genetic screening discussed, including risks and benefits. Testing {requests/ordered/declines:14581}. - Benefits of Breast Feeding were discussed. The patient is encouraged to consider nursing her baby post partum.  There are no diagnoses linked to this encounter.    Follow up in 4 weeks.    Vicci Rollo BRAVO, RN [facility]

## 2024-04-03 ENCOUNTER — Encounter: Admitting: Obstetrics and Gynecology

## 2024-04-03 ENCOUNTER — Other Ambulatory Visit (HOSPITAL_COMMUNITY)
Admission: RE | Admit: 2024-04-03 | Discharge: 2024-04-03 | Disposition: A | Discharge status: Home / Self Care | Source: Ambulatory Visit | Attending: Obstetrics | Admitting: Obstetrics

## 2024-04-03 ENCOUNTER — Encounter: Payer: Self-pay | Admitting: Obstetrics

## 2024-04-03 ENCOUNTER — Ambulatory Visit (INDEPENDENT_AMBULATORY_CARE_PROVIDER_SITE_OTHER): Admitting: Obstetrics

## 2024-04-03 VITALS — BP 130/90 | HR 101 | Ht 64.0 in | Wt 173.3 lb

## 2024-04-03 DIAGNOSIS — Z1379 Encounter for other screening for genetic and chromosomal anomalies: Secondary | ICD-10-CM

## 2024-04-03 DIAGNOSIS — Z113 Encounter for screening for infections with a predominantly sexual mode of transmission: Secondary | ICD-10-CM | POA: Diagnosis present

## 2024-04-03 DIAGNOSIS — Z3A12 12 weeks gestation of pregnancy: Secondary | ICD-10-CM

## 2024-04-03 DIAGNOSIS — Z3481 Encounter for supervision of other normal pregnancy, first trimester: Secondary | ICD-10-CM | POA: Insufficient documentation

## 2024-04-03 DIAGNOSIS — Z131 Encounter for screening for diabetes mellitus: Secondary | ICD-10-CM

## 2024-04-03 DIAGNOSIS — O34219 Maternal care for unspecified type scar from previous cesarean delivery: Secondary | ICD-10-CM | POA: Insufficient documentation

## 2024-04-03 DIAGNOSIS — O99210 Obesity complicating pregnancy, unspecified trimester: Secondary | ICD-10-CM | POA: Insufficient documentation

## 2024-04-03 DIAGNOSIS — Z6831 Body mass index (BMI) 31.0-31.9, adult: Secondary | ICD-10-CM

## 2024-04-03 DIAGNOSIS — Z8759 Personal history of other complications of pregnancy, childbirth and the puerperium: Secondary | ICD-10-CM

## 2024-04-03 MED ORDER — ASPIRIN 81 MG PO TBEC: 162.0000 mg | DELAYED_RELEASE_TABLET | Freq: Every day | ORAL | 3 refills | Status: DC

## 2024-04-04 LAB — CERVICOVAGINAL ANCILLARY ONLY
Chlamydia: NEGATIVE
Comment: NEGATIVE
Comment: NORMAL
Neisseria Gonorrhea: NEGATIVE

## 2024-04-04 LAB — COMPREHENSIVE METABOLIC PANEL WITH GFR
ALT: 7 IU/L (ref 0–32)
AST: 17 IU/L (ref 0–40)
Albumin: 4.2 g/dL (ref 3.9–4.9)
Alkaline Phosphatase: 50 IU/L (ref 44–121)
BUN/Creatinine Ratio: 11 (ref 9–23)
BUN: 6 mg/dL (ref 6–20)
Bilirubin Total: 0.2 mg/dL (ref 0.0–1.2)
CO2: 20 mmol/L (ref 20–29)
Calcium: 10 mg/dL (ref 8.7–10.2)
Chloride: 102 mmol/L (ref 96–106)
Creatinine, Ser: 0.57 mg/dL (ref 0.57–1.00)
Globulin, Total: 2.7 g/dL (ref 1.5–4.5)
Glucose: 89 mg/dL (ref 70–99)
Potassium: 4.5 mmol/L (ref 3.5–5.2)
Sodium: 135 mmol/L (ref 134–144)
Total Protein: 6.9 g/dL (ref 6.0–8.5)
eGFR: 123 mL/min/1.73 (ref >59)

## 2024-04-04 LAB — CBC/D/PLT+RPR+RH+ABO+RUBIGG...
Antibody Screen: NEGATIVE
Basophils Absolute: 0 x10E3/uL (ref 0.0–0.2)
Basos: 0 %
EOS (ABSOLUTE): 0.1 x10E3/uL (ref 0.0–0.4)
Eos: 1 %
HCV Ab: NONREACTIVE
HIV Screen 4th Generation wRfx: NONREACTIVE
Hematocrit: 40.2 % (ref 34.0–46.6)
Hemoglobin: 13.2 g/dL (ref 11.1–15.9)
Hepatitis B Surface Ag: NEGATIVE
Immature Grans (Abs): 0 x10E3/uL (ref 0.0–0.1)
Immature Granulocytes: 0 %
Lymphocytes Absolute: 1.7 x10E3/uL (ref 0.7–3.1)
Lymphs: 20 %
MCH: 28.9 pg (ref 26.6–33.0)
MCHC: 32.8 g/dL (ref 31.5–35.7)
MCV: 88 fL (ref 79–97)
Monocytes Absolute: 0.6 x10E3/uL (ref 0.1–0.9)
Monocytes: 7 %
Neutrophils Absolute: 6.2 x10E3/uL (ref 1.4–7.0)
Neutrophils: 72 %
Platelets: 232 x10E3/uL (ref 150–450)
RBC: 4.57 x10E6/uL (ref 3.77–5.28)
RDW: 13.2 % (ref 11.7–15.4)
RPR Ser Ql: NONREACTIVE
Rh Factor: POSITIVE
Rubella Antibodies, IGG: 6.39 {index} (ref >0.99)
Varicella zoster IgG: REACTIVE
WBC: 8.6 x10E3/uL (ref 3.4–10.8)

## 2024-04-04 LAB — HCV INTERPRETATION

## 2024-04-04 LAB — HEMOGLOBIN A1C
Est. average glucose Bld gHb Est-mCnc: 103 mg/dL
Hgb A1c MFr Bld: 5.2 % (ref 4.8–5.6)

## 2024-04-04 LAB — TSH+FREE T4: Free T4: 0.98 ng/dL (ref 0.82–1.77)

## 2024-04-04 LAB — TSH RFX ON ABNORMAL TO FREE T4: TSH: 2.23 u[IU]/mL (ref 0.450–4.500)

## 2024-04-05 LAB — PROTEIN / CREATININE RATIO, URINE
Creatinine, Urine: 101.2 mg/dL
Protein, Ur: 12.9 mg/dL
Protein/Creat Ratio: 127 mg/g{creat} (ref 0–200)

## 2024-04-05 LAB — CULTURE, OB URINE

## 2024-04-05 LAB — URINE CULTURE, OB REFLEX

## 2024-04-08 LAB — MATERNIT 21 PLUS CORE, BLOOD
Fetal Fraction: 17
Result (T21): NEGATIVE
Trisomy 13 (Patau syndrome): NEGATIVE
Trisomy 18 (Edwards syndrome): NEGATIVE
Trisomy 21 (Down syndrome): NEGATIVE

## 2024-05-02 ENCOUNTER — Encounter: Payer: Self-pay | Admitting: Obstetrics

## 2024-05-02 ENCOUNTER — Ambulatory Visit (INDEPENDENT_AMBULATORY_CARE_PROVIDER_SITE_OTHER): Admitting: Certified Nurse Midwife

## 2024-05-02 VITALS — BP 133/87 | HR 91 | Wt 174.5 lb

## 2024-05-02 DIAGNOSIS — Z3A16 16 weeks gestation of pregnancy: Secondary | ICD-10-CM

## 2024-05-02 DIAGNOSIS — Z362 Encounter for other antenatal screening follow-up: Secondary | ICD-10-CM | POA: Diagnosis not present

## 2024-05-02 DIAGNOSIS — Z3481 Encounter for supervision of other normal pregnancy, first trimester: Secondary | ICD-10-CM

## 2024-05-02 DIAGNOSIS — Z0283 Encounter for blood-alcohol and blood-drug test: Secondary | ICD-10-CM

## 2024-05-02 NOTE — Patient Instructions (Signed)
 Round Ligament Pain  The round ligaments are a pair of cord-like tissues that help support the uterus. They can become a source of pain during pregnancy as the ligaments soften and stretch as the baby grows. The pain usually begins in the second trimester (13-28 weeks) of pregnancy, and should only last for a few seconds when it occurs. However, the pain can come and go until the baby is delivered. The pain does not cause harm to the baby. Round ligament pain is usually a short, sharp, and pinching pain, but it can also be a dull, lingering, and aching pain. The pain is felt in the lower side of the abdomen or in the groin. It usually starts deep in the groin and moves up to the outside of the hip area. The pain may happen when you: Suddenly change position, such as quickly going from a sitting to standing position. Do physical activity. Cough or sneeze. Follow these instructions at home: Managing pain  When the pain starts, relax. Then, try any of these methods to help with the pain: Sit down. Flex your knees up to your abdomen. Lie on your side with one pillow under your abdomen and another pillow between your legs. Sit in a warm bath for 15-20 minutes or until the pain goes away. General instructions Watch your condition for any changes. Move slowly when you sit down or stand up. Stop or reduce your physical activities if they cause pain. Avoid long walks if they cause pain. Take over-the-counter and prescription medicines only as told by your health care provider. Keep all follow-up visits. This is important. Contact a health care provider if: Your pain does not go away with treatment. You feel pain in your back that you did not have before. Your medicine is not helping. You have a fever or chills. You have nausea or vomiting. You have diarrhea. You have pain when you urinate. Get help right away if: You have pain that is a rhythmic, cramping pain similar to labor pains. Labor  pains are usually 2 minutes apart, last for about 1 minute, and involve a bearing down feeling or pressure in your pelvis. You have vaginal bleeding. These symptoms may represent a serious problem that is an emergency. Do not wait to see if the symptoms will go away. Get medical help right away. Call your local emergency services (911 in the [place] Do not drive yourself to the hospital. Summary Round ligament pain is felt in the lower abdomen or groin. This pain usually begins in the second trimester (13-28 weeks) and should only last for a few seconds when it occurs. You may notice the pain when you suddenly change position, when you cough or sneeze, or during physical activity. Relaxing, flexing your knees to your abdomen, lying on one side, or taking a warm bath may help to get rid of the pain. Contact your health care provider if the pain does not go away. This information is not intended to replace advice given to you by your health care provider. Make sure you discuss any questions you have with your health care provider. Document Revised: 11/19/2020 Document Reviewed: 11/19/2020 Elsevier Patient Education  2024 [employer].

## 2024-05-02 NOTE — Progress Notes (Signed)
 ROB doing well. She is feeling some fluttering. She denies any concerns today. Discussed u/s for anatomy next visit. She is in agreement. Follow up in 4 weeks for rob.   Zelda Hummer, CNM

## 2024-05-03 LAB — MONITOR DRUG PROFILE 14(MW)
Amphetamine Scrn, Ur: NEGATIVE ng/mL
BARBITURATE SCREEN URINE: NEGATIVE ng/mL
BENZODIAZEPINE SCREEN, URINE: NEGATIVE ng/mL
Buprenorphine, Urine: NEGATIVE ng/mL
CANNABINOIDS UR QL SCN: NEGATIVE ng/mL
Cocaine (Metab) Scrn, Ur: NEGATIVE ng/mL
Creatinine(Crt), U: 218.4 mg/dL (ref 20.0–300.0)
Fentanyl, Urine: NEGATIVE pg/mL
Meperidine Screen, Urine: NEGATIVE ng/mL
Methadone Screen, Urine: NEGATIVE ng/mL
OXYCODONE+OXYMORPHONE UR QL SCN: NEGATIVE ng/mL
Opiate Scrn, Ur: NEGATIVE ng/mL
Ph of Urine: 6.4 (ref 4.5–8.9)
Phencyclidine Qn, Ur: NEGATIVE ng/mL
Propoxyphene Scrn, Ur: NEGATIVE ng/mL
SPECIFIC GRAVITY: 1.017
Tramadol Screen, Urine: NEGATIVE ng/mL

## 2024-05-03 LAB — NICOTINE SCREEN, URINE: Cotinine Ql Scrn, Ur: NEGATIVE ng/mL

## 2024-05-29 ENCOUNTER — Ambulatory Visit: Admitting: Advanced Practice Midwife

## 2024-05-29 ENCOUNTER — Ambulatory Visit (INDEPENDENT_AMBULATORY_CARE_PROVIDER_SITE_OTHER)

## 2024-05-29 VITALS — BP 123/84 | HR 79 | Wt 176.4 lb

## 2024-05-29 DIAGNOSIS — Z23 Encounter for immunization: Secondary | ICD-10-CM | POA: Diagnosis not present

## 2024-05-29 DIAGNOSIS — Z3A21 21 weeks gestation of pregnancy: Secondary | ICD-10-CM | POA: Diagnosis not present

## 2024-05-29 DIAGNOSIS — Z362 Encounter for other antenatal screening follow-up: Secondary | ICD-10-CM | POA: Diagnosis not present

## 2024-05-29 DIAGNOSIS — O0990 Supervision of high risk pregnancy, unspecified, unspecified trimester: Secondary | ICD-10-CM

## 2024-05-29 DIAGNOSIS — O0992 Supervision of high risk pregnancy, unspecified, second trimester: Secondary | ICD-10-CM

## 2024-05-29 DIAGNOSIS — O34219 Maternal care for unspecified type scar from previous cesarean delivery: Secondary | ICD-10-CM

## 2024-05-29 DIAGNOSIS — Z3A20 20 weeks gestation of pregnancy: Secondary | ICD-10-CM

## 2024-05-29 NOTE — Patient Instructions (Signed)
 Second Trimester of Pregnancy  The second trimester of pregnancy is from week 14 through week 27. This is months 4 through 6 of pregnancy. During the second trimester: Morning sickness is less or has stopped. You may have more energy. You may feel hungry more often. At this time, your unborn baby is growing very fast. At the end of the sixth month, the unborn baby may be up to 12 inches long and weigh about 1 pounds. You will likely start to feel the baby move between 16 and 20 weeks of pregnancy. Body changes during your second trimester Your body continues to change during this time. The changes usually go away after your baby is born. Physical changes You will gain more weight. Your belly will get bigger. You may begin to get stretch marks on your hips, belly, and breasts. Your breasts will keep growing and may hurt. You may get dark spots or blotches on your face. A dark line from your belly button to the pubic area may appear. This line is called linea nigra. Your hair may grow faster and get thicker. Health changes You may have headaches. You may have heartburn. You may pee more often. You may have swollen, bulging veins (varicose veins). You may have trouble pooping (constipation), or swollen veins in the butt that can itch or get painful (hemorrhoids). You may have back pain. This is caused by: Weight gain. Pregnancy hormones that are relaxing the joints in your pelvis. Follow these instructions at home: Medicines Talk to your health care provider if you're taking medicines. Ask if the medicines are safe to take during pregnancy. Your provider may change the medicines that you take. Do not take any medicines unless told to by your provider. Take a prenatal vitamin that has at least 600 micrograms (mcg) of folic acid. Do not use herbal medicines, illegal drugs, or medicines that are not approved by your provider. Eating and drinking While you're pregnant your body needs  extra food for your growing baby. Talk with your provider about what to eat while pregnant. Activity Most women are able to exercise during pregnancy. Exercises may need to change as your pregnancy goes on. Talk to your provider about your activities and exercise routines. Relieving pain and discomfort Wear a good, supportive bra if your breasts hurt. Rest with your legs raised if you have leg cramps or low back pain. Take warm sitz baths to soothe pain from hemorrhoids. Use hemorrhoid cream if your provider says it's okay. Do not douche. Do not use tampons or scented pads. Do not use hot tubs, steam rooms, or saunas. Safety Wear your seatbelt at all times when you're in a car. Talk to your provider if someone hits you, hurts you, or yells at you. Talk with your provider if you're feeling sad or have thoughts of hurting yourself. Lifestyle Certain things can be harmful while you're pregnant. It's best to avoid the following: Do not drink alcohol,smoke, vape, or use products with nicotine or tobacco in them. If you need help quitting, talk with your provider. Avoid cat litter boxes and soil used by cats. These things carry germs that can cause harm to your pregnancy and your baby. General instructions Keep all follow-up visits. It helps you and your unborn baby stay as healthy as possible. Write down your questions. Take them to your prenatal visits. Your provider will: Talk with you about your overall health. Give you advice or refer you to specialists who can help with different needs,  including: Prenatal education classes. Mental health and counseling. Foods and healthy eating. Ask for help if you need help with food. Where to find more information American Pregnancy Association: americanpregnancy.org [employer]ians [employer]necologists: acog.org Office on [employer] Health: [employer] Contact a health care provider if: You have a headache that does not go away  when you take medicine. You have any of these problems: You can't eat or drink. You throw up or feel like you may throw up. You have watery poop (diarrhea) for 2 days or more. You have pain when you pee or your pee smells bad. You have been sick for 2 days or more and are not getting better. Contact your provider right away if: You have any of these coming from your vagina: Abnormal discharge. Bad-smelling fluid. Bleeding. Your baby is moving less than usual. You have contractions, belly cramping, or have pain in your pelvis or lower back. You have symptoms of high blood pressure or preeclampsia. These include: A severe, throbbing headache that does not go away. Sudden or extreme swelling of your face, hands, legs, or feet. Vision problems: You see spots. You have blurry vision. Your eyes are sensitive to light. If you can't reach the provider, go to an urgent care or emergency room. Get help right away if: You faint, become confused, or can't think clearly. You have chest pain or trouble breathing. You have any kind of injury, such as from a fall or a car crash. These symptoms may be an emergency. Call 911 right away. Do not wait to see if the symptoms will go away. Do not drive yourself to the hospital. This information is not intended to replace advice given to you by your health care provider. Make sure you discuss any questions you have with your health care provider. Document Revised: 06/09/2023 Document Reviewed: 01/07/2023 Elsevier Patient Education  2024 [employer].

## 2024-05-29 NOTE — Progress Notes (Signed)
 Routine Prenatal Care Visit  Subjective  Sharon Gardner is a 33 y.o. [phone] at [redacted]w[redacted]d being seen today for ongoing prenatal care.  She is currently monitored for the following issues for this high-risk pregnancy and has History of gestational hypertension; S/P primary low transverse C-section; Frequent headaches; S/P repeat low transverse C-section; Depressive disorder; PCOS (polycystic ovarian syndrome); GAD (generalized anxiety disorder); Anxiety; Other fatigue; Supervision of high risk pregnancy, antepartum; Previous cesarean delivery affecting pregnancy, antepartum; and Obesity in pregnancy, antepartum on their problem list.  ----------------------------------------------------------------------------------- Patient reports pain in bilateral wrist/hands. Probably carpal tunnel. Reviewed comfort measures.   Contractions: Not present. Vag. Bleeding: None.  Movement: Present. Leaking Fluid denies.  ----------------------------------------------------------------------------------- The following portions of the patient's history were reviewed and updated as appropriate: allergies, current medications, past family history, past medical history, past social history, past surgical history and problem list. Problem list updated.  Objective  Blood pressure 123/84, pulse 79, weight 176 lb 6.4 oz (80 kg), last menstrual period 01/07/2024. Pregravid weight 182 lb (82.6 kg) Total Weight Gain -5 lb 9.6 oz (-2.54 kg) Urinalysis: Urine Protein    Urine Glucose    Fetal Status: Fetal Heart Rate (bpm): 155 Fundal Height: 21 cm Movement: Present     General:  Alert, oriented and cooperative. Patient is in no acute distress.  Skin: Skin is warm and dry. No rash noted.   Cardiovascular: Normal heart rate noted  Respiratory: Normal respiratory effort, no problems with respiration noted  Abdomen: Soft, gravid, appropriate for gestational age. Pain/Pressure: Absent     Pelvic:  Cervical exam deferred         Extremities: Normal range of motion.  Edema: None  Mental Status: Normal mood and affect. Normal behavior. Normal judgment and thought content.   Assessment   33 y.o. [id number] at [redacted]w[redacted]d by  10/13/2024, by Last Menstrual Period presenting for routine prenatal visit  Plan   G4 Problems (from 02/27/24 to present)     Problem Noted Diagnosed Resolved   Obesity in pregnancy, antepartum 04/03/2024 by Leigh Sober, MD  No   Overview Signed 04/03/2024  5:15 PM by Leigh Sober, MD  04/03/24: pre-gravid BMI 31.22      Supervision of high risk pregnancy, antepartum 02/27/2024 by Vicci Rollo BRAVO, RN  No   Overview Addendum 05/29/2024 10:05 AM by Lynda Bradley, CNM   Clinical Staff Provider  Office Location  [facility] Dating  By LMP c/w 10w u/s  Language  English Anatomy US     Flu Vaccine  05/29/24 Genetic Screen  NIPS: negative, female  TDaP vaccine  Offer Hgb A1C or  GTT Early : 5.2 Third trimester :   Covid X2 Moderna   LAB RESULTS   Rhogam  AB/Positive/-- (07/15 1511) Blood Type AB/Positive/-- (07/15 1511)   RSV Offer Antibody Negative (07/15 1511)  Feeding Plan Breast Rubella 6.39 (07/15 1511)  Contraception Unsure 05-29-24 RPR Non Reactive (07/15 1511)   Circumcision N/A HBsAg Negative (07/15 1511)   Pediatrician  [facility] HIV Non Reactive (07/15 1511)  Support Person Husband-Eddie Varicella Reactive (07/15 1511)  Prenatal Classes No GBS  (For PCN allergy, check sensitivities)     Hep C Non Reactive (07/15 1511)   BTL Consent  Pap No results found for: DIAGPAP  VBAC Consent Desires repeat Hgb Electro      CF      SMA                    Preterm  labor symptoms and general obstetric precautions including but not limited to vaginal bleeding, contractions, leaking of fluid and fetal movement were reviewed in detail with the patient. Please refer to After Visit Summary for other counseling recommendations.   Return in about 4 weeks (around 06/26/2024) for f/u anatomy and  rob.  Slater Rains, CNM 05/29/2024 10:12 AM

## 2024-06-03 ENCOUNTER — Encounter: Payer: Self-pay | Admitting: Obstetrics

## 2024-06-25 NOTE — Progress Notes (Unsigned)
    Return Prenatal Note   Subjective   33 y.o. H5E7987 at [redacted]w[redacted]d presents for this follow-up prenatal visit.  Patient feeling well, f/u u/s today, prelim report reviewed anatomy now complete.  Patient reports: Movement: Present Contractions: Irritability  Objective   Flow sheet Vitals: Pulse Rate: 93 BP: 129/79 Fundal Height: 24 cm Fetal Heart Rate (bpm): 159 Total weight gain: 3.2 oz (0.091 kg)  General Appearance  No acute distress, well appearing, and well nourished Pulmonary   Normal work of breathing Neurologic   Alert and oriented to person, place, and time Psychiatric   Mood and affect within normal limits   Assessment/Plan   Plan  33 y.o. H5E7987 at [redacted]w[redacted]d presents for follow-up OB visit. Reviewed prenatal record including previous visit note.  Pregnancy-induced hypertension Initial BP elevated, repeat WDL, taking BP at home, log uploaded to media. Normotensive except for two values yesterday. Reinforced s/sx of pre-eclampsia. HELLP panel collected today.  Supervision of high risk pregnancy, antepartum Red flag symptoms reviewed. Prepared for 28w labs at next visit.      Orders Placed This Encounter  Procedures   28 Week RH+Panel    Standing Status:   Future    Number of Occurrences:   1    Expected Date:   07/17/2024    Expiration Date:   06/26/2025   Comprehensive metabolic panel with GFR   CBC   Protein / creatinine ratio, urine   Return in 4 weeks (on 07/24/2024) for ROB & GDM screening; needs visitr with Roby for delivery planning.   Future Appointments  Date Time Provider Department Center  07/24/2024  8:20 AM [facility] None  07/24/2024  8:35 AM Leigh Sober, MD [facility] None     For next visit:  continue with routine prenatal care     Harlene LITTIE Cisco, CNM  10/07/251:09 PM

## 2024-06-26 ENCOUNTER — Ambulatory Visit (INDEPENDENT_AMBULATORY_CARE_PROVIDER_SITE_OTHER)

## 2024-06-26 ENCOUNTER — Ambulatory Visit (INDEPENDENT_AMBULATORY_CARE_PROVIDER_SITE_OTHER): Admitting: Certified Nurse Midwife

## 2024-06-26 VITALS — BP 129/79 | HR 93 | Wt 182.2 lb

## 2024-06-26 DIAGNOSIS — Z131 Encounter for screening for diabetes mellitus: Secondary | ICD-10-CM | POA: Diagnosis not present

## 2024-06-26 DIAGNOSIS — Z3A24 24 weeks gestation of pregnancy: Secondary | ICD-10-CM

## 2024-06-26 DIAGNOSIS — O0990 Supervision of high risk pregnancy, unspecified, unspecified trimester: Secondary | ICD-10-CM

## 2024-06-26 DIAGNOSIS — Z369 Encounter for antenatal screening, unspecified: Secondary | ICD-10-CM | POA: Diagnosis not present

## 2024-06-26 DIAGNOSIS — Z114 Encounter for screening for human immunodeficiency virus [HIV]: Secondary | ICD-10-CM

## 2024-06-26 DIAGNOSIS — Z113 Encounter for screening for infections with a predominantly sexual mode of transmission: Secondary | ICD-10-CM

## 2024-06-26 DIAGNOSIS — Z362 Encounter for other antenatal screening follow-up: Secondary | ICD-10-CM

## 2024-06-26 DIAGNOSIS — O139 Gestational [pregnancy-induced] hypertension without significant proteinuria, unspecified trimester: Secondary | ICD-10-CM

## 2024-06-26 DIAGNOSIS — O162 Unspecified maternal hypertension, second trimester: Secondary | ICD-10-CM

## 2024-06-26 NOTE — Assessment & Plan Note (Signed)
 Red flag symptoms reviewed. Prepared for 28w labs at next visit.

## 2024-06-26 NOTE — Assessment & Plan Note (Signed)
" >>  ASSESSMENT AND PLAN FOR MILD PREECLAMPSIA WRITTEN ON 06/26/2024  1:06 PM BY Jorja Empie L, CNM  Initial BP elevated, repeat WDL, taking BP at home, log uploaded to media. Normotensive except for two values yesterday. Reinforced s/sx of pre-eclampsia. HELLP panel collected today. "

## 2024-06-26 NOTE — Patient Instructions (Addendum)
 Oral Glucose Tolerance Test During Pregnancy Why am I having this test? The oral glucose tolerance test (OGTT) is done to check how your body uses blood sugar, also called glucose. It's one of many tests used to diagnose the type of diabetes you can get while pregnant. This type of diabetes is called gestational diabetes mellitus (GDM). You may get GDM during the middle part of your pregnancy. In most cases, it goes away after you give birth. Most people get tested for GDM around weeks 24-28 of pregnancy. You may have the test sooner if: You or your mother had diabetes while pregnant. A person in your family has diabetes. You're having more than one baby this pregnancy. You've had a baby before who weighed more than 9 pounds (4 kg) at birth. You have high blood pressure or heart disease. You have a large body. You're not active. What is being tested? This test measures your blood sugar at different times. It shows how well your body uses the sugar in your blood. What kind of sample is taken?  A sample of blood is needed for this test. The sample is taken by putting a needle into a blood vessel. How do I prepare for this test? Eat your normal meals the day before the test. Your health care provider will tell you about: Eating or drinking on the day of the test. You may need to fast for 8-10 hours before the test. When you fast, you can only have water. Changing or stopping your regular medicines. Some medicines may affect your test results. Tell a health care provider about: All medicines you take. These include vitamins, herbs, eye drops, and creams. What happens during the test? The test involves these steps: Your blood sugar will be checked. It's called your fasting blood sugar if you fasted before the test. You'll drink a sugary mixture. Your blood sugar will be checked again. For a 1-hour test, it will be checked after an hour. For a 3-hour test, it will be checked 1, 2, and 3 hours  after you drink the sugary mixture. The test takes 1-3 hours. You'll need to stay at the testing place during this time. During the testing time: Do not eat or drink anything after the sugary drink. Do not exercise. Do not use any products that contain nicotine  or tobacco. These products include cigarettes, chewing tobacco, and vaping devices, such as e-cigarettes. The test may vary among providers and hospitals. How are the results reported? Your provider will compare your results to normal values for the kind of test that you had done. You may need to call or meet with your provider to get your results. What do the results mean? Your provider can tell you what blood sugar levels are normal for the test you're doing. If two or more of your blood sugar levels are at or above normal, you may be diagnosed with GDM. If only one level is high, your provider may suggest: Doing the test again. Doing other tests to confirm a diagnosis. Talk with your provider about what your results mean. Questions to ask your health care provider Ask your provider, or the department doing the test: When will my results be ready? How will I get my results? What are my next steps? This information is not intended to replace advice given to you by your health care provider. Make sure you discuss any questions you have with your health care provider. Document Revised: 01/10/2023 Document Reviewed: 01/10/2023 Elsevier Patient Education  2024 [employer]Second Trimester of Pregnancy  The second trimester of pregnancy is from week 14 through week 27. This is months 4 through 6 of pregnancy. During the second trimester: Morning sickness is less or has stopped. You may have more energy. You may feel hungry more often. At this time, your unborn baby is growing very fast. At the end of the sixth month, the unborn baby may be up to 12 inches long and weigh about 1 pounds. You will likely start to feel the baby move  between 16 and 20 weeks of pregnancy. Body changes during your second trimester Your body continues to change during this time. The changes usually go away after your baby is born. Physical changes You will gain more weight. Your belly will get bigger. You may begin to get stretch marks on your hips, belly, and breasts. Your breasts will keep growing and may hurt. You may get dark spots or blotches on your face. A dark line from your belly button to the pubic area may appear. This line is called linea nigra. Your hair may grow faster and get thicker. Health changes You may have headaches. You may have heartburn. You may pee more often. You may have swollen, bulging veins (varicose veins). You may have trouble pooping (constipation), or swollen veins in the butt that can itch or get painful (hemorrhoids). You may have back pain. This is caused by: Weight gain. Pregnancy hormones that are relaxing the joints in your pelvis. Follow these instructions at home: Medicines Talk to your health care provider if you're taking medicines. Ask if the medicines are safe to take during pregnancy. Your provider may change the medicines that you take. Do not take any medicines unless told to by your provider. Take a prenatal vitamin that has at least 600 micrograms (mcg) of folic acid. Do not use herbal medicines, illegal drugs, or medicines that are not approved by your provider. Eating and drinking While you're pregnant your body needs extra food for your growing baby. Talk with your provider about what to eat while pregnant. Activity Most women are able to exercise during pregnancy. Exercises may need to change as your pregnancy goes on. Talk to your provider about your activities and exercise routines. Relieving pain and discomfort Wear a good, supportive bra if your breasts hurt. Rest with your legs raised if you have leg cramps or low back pain. Take warm sitz baths to soothe pain from  hemorrhoids. Use hemorrhoid cream if your provider says it's okay. Do not douche. Do not use tampons or scented pads. Do not use hot tubs, steam rooms, or saunas. Safety Wear your seatbelt at all times when you're in a car. Talk to your provider if someone hits you, hurts you, or yells at you. Talk with your provider if you're feeling sad or have thoughts of hurting yourself. Lifestyle Certain things can be harmful while you're pregnant. It's best to avoid the following: Do not drink alcohol,smoke, vape, or use products with nicotine  or tobacco in them. If you need help quitting, talk with your provider. Avoid cat litter boxes and soil used by cats. These things carry germs that can cause harm to your pregnancy and your baby. General instructions Keep all follow-up visits. It helps you and your unborn baby stay as healthy as possible. Write down your questions. Take them to your prenatal visits. Your provider will: Talk with you about your overall health. Give you advice or refer you to specialists who can help  with different needs, including: Prenatal education classes. Mental health and counseling. Foods and healthy eating. Ask for help if you need help with food. Where to find more information American Pregnancy Association: americanpregnancy.org [employer]: acog.org Office on [employer] Health: [employer] Contact a health care provider if: You have a headache that does not go away when you take medicine. You have any of these problems: You can't eat or drink. You throw up or feel like you may throw up. You have watery poop (diarrhea) for 2 days or more. You have pain when you pee or your pee smells bad. You have been sick for 2 days or more and are not getting better. Contact your provider right away if: You have any of these coming from your vagina: Abnormal discharge. Bad-smelling fluid. Bleeding. Your baby is moving less than  usual. You have contractions, belly cramping, or have pain in your pelvis or lower back. You have symptoms of high blood pressure or preeclampsia. These include: A severe, throbbing headache that does not go away. Sudden or extreme swelling of your face, hands, legs, or feet. Vision problems: You see spots. You have blurry vision. Your eyes are sensitive to light. If you can't reach the provider, go to an urgent care or emergency room. Get help right away if: You faint, become confused, or can't think clearly. You have chest pain or trouble breathing. You have any kind of injury, such as from a fall or a car crash. These symptoms may be an emergency. Call 911 right away. Do not wait to see if the symptoms will go away. Do not drive yourself to the hospital. This information is not intended to replace advice given to you by your health care provider. Make sure you discuss any questions you have with your health care provider. Document Revised: 06/09/2023 Document Reviewed: 01/07/2023 Elsevier Patient Education  2024 [employer].

## 2024-06-26 NOTE — Assessment & Plan Note (Addendum)
 Initial BP elevated, repeat WDL, taking BP at home, log uploaded to media. Normotensive except for two values yesterday. Reinforced s/sx of pre-eclampsia. HELLP panel collected today.

## 2024-06-27 ENCOUNTER — Ambulatory Visit: Payer: Self-pay | Admitting: Certified Nurse Midwife

## 2024-06-27 LAB — COMPREHENSIVE METABOLIC PANEL WITH GFR
ALT: 8 IU/L (ref 0–32)
AST: 16 IU/L (ref 0–40)
Albumin: 3.7 g/dL — ABNORMAL LOW (ref 3.9–4.9)
Alkaline Phosphatase: 66 IU/L (ref 41–116)
BUN/Creatinine Ratio: 10 (ref 9–23)
BUN: 6 mg/dL (ref 6–20)
Bilirubin Total: 0.3 mg/dL (ref 0.0–1.2)
CO2: 19 mmol/L — ABNORMAL LOW (ref 20–29)
Calcium: 9 mg/dL (ref 8.7–10.2)
Chloride: 103 mmol/L (ref 96–106)
Creatinine, Ser: 0.59 mg/dL (ref 0.57–1.00)
Globulin, Total: 2.8 g/dL (ref 1.5–4.5)
Glucose: 70 mg/dL (ref 70–99)
Potassium: 3.9 mmol/L (ref 3.5–5.2)
Sodium: 137 mmol/L (ref 134–144)
Total Protein: 6.5 g/dL (ref 6.0–8.5)
eGFR: 122 mL/min/1.73 (ref >59)

## 2024-06-27 LAB — CBC
Hematocrit: 34.1 % (ref 34.0–46.6)
Hemoglobin: 11 g/dL — ABNORMAL LOW (ref 11.1–15.9)
MCH: 28.6 pg (ref 26.6–33.0)
MCHC: 32.3 g/dL (ref 31.5–35.7)
MCV: 89 fL (ref 79–97)
Platelets: 200 x10E3/uL (ref 150–450)
RBC: 3.84 x10E6/uL (ref 3.77–5.28)
RDW: 13 % (ref 11.7–15.4)
WBC: 10.5 x10E3/uL (ref 3.4–10.8)

## 2024-06-27 LAB — PROTEIN / CREATININE RATIO, URINE
Creatinine, Urine: 84.3 mg/dL
Protein, Ur: 12.4 mg/dL
Protein/Creat Ratio: 147 mg/g{creat} (ref 0–200)

## 2024-07-05 ENCOUNTER — Ambulatory Visit

## 2024-07-05 ENCOUNTER — Other Ambulatory Visit: Payer: Self-pay | Admitting: Obstetrics

## 2024-07-05 ENCOUNTER — Telehealth: Payer: Self-pay

## 2024-07-05 VITALS — BP 132/91 | HR 104 | Wt 184.3 lb

## 2024-07-05 DIAGNOSIS — Z0130 Encounter for examination of blood pressure without abnormal findings: Secondary | ICD-10-CM

## 2024-07-05 DIAGNOSIS — O139 Gestational [pregnancy-induced] hypertension without significant proteinuria, unspecified trimester: Secondary | ICD-10-CM

## 2024-07-05 NOTE — Patient Instructions (Signed)
 High Blood Pressure and Pregnancy (Pre-Eclampsia and Eclampsia): What to Know Pre-eclampsia is a serious problem that may happen in the second half of pregnancy. When you have this condition, you have high blood pressure that may cause headaches, stomach pain, changes in your vision, and more swelling in the legs, hands, and face. It can also cause problems in the liver, heart, and kidneys. This condition must be treated early. If not treated early, it can cause major problems for both the mother and baby. If it gets worse, seizures may happen. When a seizure happens, it is then called eclampsia. There is no cure for pre-eclampsia. Having the baby early may be the best treatment. For most women, symptoms of pre-eclampsia and eclampsia go away after giving birth. What are the causes? The cause of this condition is not known. What increases the risk? Risks during pregnancy: You're more likely to get this condition during pregnancy if: This is your first pregnancy. You're pregnant with more than one baby. You had very high blood pressure in a past pregnancy. You have other family members who had pre-eclampsia. You're older than age 52. You have obesity. You got pregnant through fertility treatments. High blood pressure during pregnancy is more likely if you have a condition that affects blood or oxygen delivery to your placenta and baby. These include: Kidney disease. Diabetes. Blood clotting disorders. Autoimmune diseases, such as lupus. Sleep apnea. Risks after delivery: If you had blood pressure problems during your pregnancy, you're more likely to get this condition after giving birth. High blood pressure can stay around or even start 12 weeks or more after delivery. For some people, medical treatment is needed to control high blood pressure and prevent serious complications. What are the signs or symptoms? Common symptoms of this condition include: A headache that doesn't go away when you  take medicine. Vision problems. You may have blurred or double vision. You may also be sensitive to light. Pain in the shoulder or stomach, especially the upper right area near your ribs. Sudden weight gain because of fluid buildup in the body. This causes swelling of the face, hands, legs, and feet. Other symptoms that may develop as the condition gets worse include: Throwing up all the time, or feeling that you may throw up. Peeing less than usual. Working harder to breathe. Seizures or stroke. How is this diagnosed? Your health care provider will ask you about symptoms and check for signs of this condition during your prenatal visits. You will also have tests, such as: Blood pressure checks. Pee (urine) tests. Blood tests. Monitoring of your baby's heart rate. Ultrasounds to check for the growth of your baby. How is this treated? You and your provider will decide the treatment that is best for you. Treatment may include: Visiting the doctor more often during your pregnancy. Medicine to lower your blood pressure. Medicine to prevent seizures. Low-dose aspirin during your pregnancy. Staying in the hospital. This may be needed in very serious cases. Delivering your baby earlier than planned. Work with your provider to: Treat long-term (chronic) health conditions, such as diabetes or kidney problems. Manage weight gain while you are pregnant. Follow these instructions at home: Eating and drinking Drink more fluids as told. Avoid caffeine. Caffeine may increase blood pressure. Eat less salt in your food. Lifestyle Do not use alcohol or drugs. Do not smoke, vape, or use nicotine or tobacco. Avoid stress as much as you can. Rest and get plenty of sleep. General instructions  Take your  medicines only as told. When lying down, lie on your left side. This keeps pressure off your major blood vessels to make sure enough blood is getting to you and your baby. When sitting or lying  down, raise your feet. Try putting pillows under your lower legs. Exercise as told. Ask your provider what kinds of exercise are best for you. Check your blood pressure as often as told by your provider. Keep all follow-up visits. Your provider will do exams and tests to keep you and your baby safe. Contact a health care provider if: Your baby is not moving as much as usual. You're very tired, you feel dizzy, or you're faint. You throw up or you feel like you may throw up, and these symptoms last 24 hours or longer. You have watery poop (diarrhea) that lasts 24 hours or longer. You have shoulder pain. You have worsening anxiety, or you feel that something very bad is about to happen to you. You have cramping in your belly, or you have pain in your hips or lower back. Get help right away if: You have any of the following symptoms: Shortness of breath or trouble breathing. Chest pain. Trouble speaking or slurred speech. You faint, have a seizure, or cannot think clearly. You have symptoms of major problems, such as: A headache that doesn't go away when you take medicine. Sudden or very bad swelling of your face, hands, legs, or feet. Vision problems. You may: See spots. Have blurry vision. Be sensitive to light. These symptoms may be an emergency. Call 911 right away. Do not wait to see if the symptoms will go away. Do not drive yourself to the hospital. This information is not intended to replace advice given to you by your health care provider. Make sure you discuss any questions you have with your health care provider. Document Revised: 04/20/2023 Document Reviewed: 03/20/2023 Elsevier Patient Education  2024 [employer].

## 2024-07-05 NOTE — Progress Notes (Signed)
    NURSE VISIT NOTE  Subjective:    Patient ID: Sharon Gardner, female    DOB: 10/21/1990, 33 y.o.   MRN: [id number]  HPI  Patient is a 33 y.o. [id number] female who presents for BP check per after speaking with Triage nurse in regards to elevated readings at home.    BP Readings from Last 3 Encounters:  07/05/24 (!) 132/91  06/26/24 129/79  05/29/24 123/84   Pulse Readings from Last 3 Encounters:  07/05/24 (!) 104  06/26/24 93  05/29/24 79    Objective:    BP (!) 132/91   Pulse (!) 104   Wt 184 lb 4.8 oz (83.6 kg)   LMP 01/07/2024 (Exact Date)   BMI 31.64 kg/m   Assessment:   1. Pregnancy induced hypertension, antepartum      Plan:   Per Dr. Eleanor Canny, CNM:  Per Eleanor Canny, patient to monitor blood pressure at home checking daily, return back on Monday 07/09/24 for nurse visit to recheck blood pressure, if elevated at visit on 07/09/24 (>140/90) patient will be started on medication. Handout was provided to patient today with signs and symptoms of preeclampsia.   Patient verbalized understanding of instructions.   Rollo JINNY Maxin, CMA

## 2024-07-05 NOTE — Telephone Encounter (Signed)
 Pt called wanting a BP check, states her BP at home have been 148/96,139/92,137/93. She had some Right rib pain last night, she is unsure if it was gas or the baby licking. Today her left shoulder is sore. I was advised we had no openings in office with a provider today. I advised her she could go to the ER and have preeclampsia labs done. She didn't seem to want to do that. She reports no headaches. I did get her a blood pressure check here at 3:15.

## 2024-07-09 ENCOUNTER — Ambulatory Visit (INDEPENDENT_AMBULATORY_CARE_PROVIDER_SITE_OTHER)

## 2024-07-09 VITALS — BP 136/88 | HR 91

## 2024-07-09 DIAGNOSIS — R030 Elevated blood-pressure reading, without diagnosis of hypertension: Secondary | ICD-10-CM

## 2024-07-09 DIAGNOSIS — Z0130 Encounter for examination of blood pressure without abnormal findings: Secondary | ICD-10-CM

## 2024-07-09 NOTE — Patient Instructions (Signed)
 High Blood Pressure During Pregnancy High blood pressure, or hypertension, is when the blood in your body moves with so much force that it could affect your health. It can cause problems for you and your baby. Three types of high blood pressure that can happen during pregnancy. They are: Chronic high blood pressure. This is when you've had high blood pressure for a while, even before getting pregnant. It doesn't go away after your baby is born. Gestational high blood pressure. This type starts after you're [redacted] weeks pregnant and usually goes away once your baby is born. Postpartum high blood pressure. This type is most common within 1 to 2 days after delivery, but it can also happen later -- even up to 12 weeks or more after pregnancy. It can be: High blood pressure that you had before your baby was born and continues after delivery. High blood pressure that starts after you've given birth. If your blood pressure gets really high, it's an emergency. You need to be treated right away. How does high blood pressure affect me? If you had blood pressure problems during pregnancy, you're more likely to get this condition after giving birth. High blood pressure can even happen 12 weeks or more after your baby is born. You may also: Get it when you are pregnant next time. Get it later in life. In some cases, this condition can cause serious problems, such as: Heart problems, such as stroke or heart attack. Injury to kidneys, lungs, or liver. Pre-eclampsia. HELLP syndrome. Seizures. Problems with the placenta. How does high blood pressure affect my baby? Your baby may: Be born early. Have a low birth weight. Have problems during labor. This may mean a C-section could be needed quickly. What are the risks for high blood pressure during pregnancy? You're more likely to get high blood pressure during pregnancy if: You had high blood pressure during a past pregnancy. You're overweight. You're 35 years  or older. You're pregnant for the first time. You're pregnant with more than one baby. You used a fertility method, such as IVF, to get pregnant. You have other problems, such as diabetes, kidney disease, or lupus. What can I do to lower my risk?  Keep a healthy weight. Eat a healthy diet. If you have long-term (chronic) conditions, have them treated before you become pregnant. How is this condition treated? Treatment depends on the type of high blood pressure you have and how serious it is. If you have high blood pressure, your health care provider may give you medicine to treat it and also to lessen risks to you and the baby. If you have very bad high blood pressure, you may need to stay in the hospital for treatment. If your condition gets worse, your baby may need to be born early. If you were taking medicine for your blood pressure before you got pregnant, talk with your provider. You may need to change the medicine during pregnancy if it is not safe for your baby. Follow these instructions at home: Eating and drinking Drink more fluids as told. Avoid caffeine. Lifestyle Do not use alcohol or drugs. Do not smoke, vape, or use nicotine or tobacco. Avoid stress as much as you can. Rest and get plenty of sleep. Get regular exercise. This can help to lower your blood pressure. Ask your health care provider what kinds of exercise are safe for you. General instructions Take your medicines only as told. Keep all follow-up visits. Your provider will check your blood pressure and  make sure that you and your baby are healthy. Contact a health care provider if: Your baby is not moving as much as usual. You feel very tired. You feel faint or dizzy. You throw up, or feel like you may throw up. You have cramping in your belly or have pain in your hips or lower back. You have spotting or bleeding, or you leak fluid from your vagina. Get help right away if: You have chest pain or trouble  breathing. You faint, have a seizure, or cannot think clearly. You have symptoms of serious problems, such as: A headache that doesn't go away when you take medicine. Very bad and sudden swelling of your face, hands, legs, or feet. Vision problems, such as: Seeing spots. Blurry vision. Being sensitive to light. These symptoms may be an emergency. Call 911 right away. Do not wait to see if the symptoms will go away. Do not drive yourself to the hospital. This information is not intended to replace advice given to you by your health care provider. Make sure you discuss any questions you have with your health care provider. Document Revised: 07/19/2023 Document Reviewed: 03/01/2023 Elsevier Patient Education  2024 [employer].

## 2024-07-09 NOTE — Progress Notes (Signed)
    NURSE VISIT NOTE  Subjective:    Patient ID: Sharon Gardner, female    DOB: [birth year], 33 y.o.   MRN: [id number]  HPI  Patient is a 33 y.o. [id number] female who presents for BP check after reporting elevated pressures at home.  She says they have been in the high 130's over high 80's at home.  Nothing over 140/90 since the 16th when she called to report her elevated Bps (132/91 at BP check visit 10/16).  She says she has had an intermittent HA at the base of her skull over the weekend that goes away on it's own.  Nothing at the moment.  Denies any vision changes or RUQ pain.    BP Readings from Last 3 Encounters:  07/09/24 136/88  07/05/24 (!) 132/91  06/26/24 129/79   Pulse Readings from Last 3 Encounters:  07/09/24 91  07/05/24 (!) 104  06/26/24 93    Objective:    BP 136/88   Pulse 91   LMP 01/07/2024 (Exact Date)   Assessment:   1. Elevated blood pressure reading      Plan:    Patient to continue monitoring at home and will call with any numbers over 140/90.  She will also monitor for a HA that is not relieved with Tylenol , vision changes or RUQ pain.  She has a scheduled ROB on 07/24/24 but will return sooner if blood pressure rises.  Patient verbalized understanding of instructions.   Rollo FORBES Louder, RN

## 2024-07-10 ENCOUNTER — Telehealth: Payer: Self-pay

## 2024-07-10 ENCOUNTER — Ambulatory Visit (INDEPENDENT_AMBULATORY_CARE_PROVIDER_SITE_OTHER)

## 2024-07-10 VITALS — BP 147/88 | HR 98 | Ht 64.0 in | Wt 185.7 lb

## 2024-07-10 DIAGNOSIS — O132 Gestational [pregnancy-induced] hypertension without significant proteinuria, second trimester: Secondary | ICD-10-CM

## 2024-07-10 DIAGNOSIS — Z0130 Encounter for examination of blood pressure without abnormal findings: Secondary | ICD-10-CM

## 2024-07-10 NOTE — Patient Instructions (Signed)
 High Blood Pressure During Pregnancy High blood pressure, or hypertension, is when the blood in your body moves with so much force that it could affect your health. It can cause problems for you and your baby. Three types of high blood pressure that can happen during pregnancy. They are: Chronic high blood pressure. This is when you've had high blood pressure for a while, even before getting pregnant. It doesn't go away after your baby is born. Gestational high blood pressure. This type starts after you're [redacted] weeks pregnant and usually goes away once your baby is born. Postpartum high blood pressure. This type is most common within 1 to 2 days after delivery, but it can also happen later -- even up to 12 weeks or more after pregnancy. It can be: High blood pressure that you had before your baby was born and continues after delivery. High blood pressure that starts after you've given birth. If your blood pressure gets really high, it's an emergency. You need to be treated right away. How does high blood pressure affect me? If you had blood pressure problems during pregnancy, you're more likely to get this condition after giving birth. High blood pressure can even happen 12 weeks or more after your baby is born. You may also: Get it when you are pregnant next time. Get it later in life. In some cases, this condition can cause serious problems, such as: Heart problems, such as stroke or heart attack. Injury to kidneys, lungs, or liver. Pre-eclampsia. HELLP syndrome. Seizures. Problems with the placenta. How does high blood pressure affect my baby? Your baby may: Be born early. Have a low birth weight. Have problems during labor. This may mean a C-section could be needed quickly. What are the risks for high blood pressure during pregnancy? You're more likely to get high blood pressure during pregnancy if: You had high blood pressure during a past pregnancy. You're overweight. You're 35 years  or older. You're pregnant for the first time. You're pregnant with more than one baby. You used a fertility method, such as IVF, to get pregnant. You have other problems, such as diabetes, kidney disease, or lupus. What can I do to lower my risk?  Keep a healthy weight. Eat a healthy diet. If you have long-term (chronic) conditions, have them treated before you become pregnant. How is this condition treated? Treatment depends on the type of high blood pressure you have and how serious it is. If you have high blood pressure, your health care provider may give you medicine to treat it and also to lessen risks to you and the baby. If you have very bad high blood pressure, you may need to stay in the hospital for treatment. If your condition gets worse, your baby may need to be born early. If you were taking medicine for your blood pressure before you got pregnant, talk with your provider. You may need to change the medicine during pregnancy if it is not safe for your baby. Follow these instructions at home: Eating and drinking Drink more fluids as told. Avoid caffeine. Lifestyle Do not use alcohol or drugs. Do not smoke, vape, or use nicotine or tobacco. Avoid stress as much as you can. Rest and get plenty of sleep. Get regular exercise. This can help to lower your blood pressure. Ask your health care provider what kinds of exercise are safe for you. General instructions Take your medicines only as told. Keep all follow-up visits. Your provider will check your blood pressure and  make sure that you and your baby are healthy. Contact a health care provider if: Your baby is not moving as much as usual. You feel very tired. You feel faint or dizzy. You throw up, or feel like you may throw up. You have cramping in your belly or have pain in your hips or lower back. You have spotting or bleeding, or you leak fluid from your vagina. Get help right away if: You have chest pain or trouble  breathing. You faint, have a seizure, or cannot think clearly. You have symptoms of serious problems, such as: A headache that doesn't go away when you take medicine. Very bad and sudden swelling of your face, hands, legs, or feet. Vision problems, such as: Seeing spots. Blurry vision. Being sensitive to light. These symptoms may be an emergency. Call 911 right away. Do not wait to see if the symptoms will go away. Do not drive yourself to the hospital. This information is not intended to replace advice given to you by your health care provider. Make sure you discuss any questions you have with your health care provider. Document Revised: 07/19/2023 Document Reviewed: 03/01/2023 Elsevier Patient Education  2024 [employer].

## 2024-07-10 NOTE — Telephone Encounter (Signed)
 TRIAGE VOICEMAIL: Patient states she was here yesterday for a BP check. This morning she woke up and her hand was swollen. It's only the index finger and the middle finger on her right hand. It's really painful and she can't really move it. She hasn't checked her BP yet this morning. Inquiring if she needs to be seen.

## 2024-07-10 NOTE — Progress Notes (Signed)
    NURSE VISIT NOTE  Subjective:    Patient ID: Sharon Gardner, female    DOB: [birth year], 33 y.o.   MRN: [id number]  HPI  Patient is a 33 y.o. [phone] female who presents for BP check per order from Estil Mangle, MD.   Patient reports compliance with prescribed BP medications: no    BP Readings from Last 3 Encounters:  07/10/24 (!) 147/88  07/09/24 136/88  07/05/24 (!) 132/91   Pulse Readings from Last 3 Encounters:  07/10/24 98  07/09/24 91  07/05/24 (!) 104    Objective:    BP (!) 147/88   Pulse 98   Ht 5' 4 (1.626 m)   Wt 185 lb 11.2 oz (84.2 kg)   LMP 01/07/2024 (Exact Date)   BMI 31.88 kg/m   Assessment:   1. BP check   2. Gestational hypertension, second trimester      Plan:   Per Dr. Estil Mangle, MD:  Continue to monitor blood pressure at home. Report any reading >140/90 or with any associated symptoms. See Ortho or Urgent care for pain and swelling in hand  Patient verbalized understanding of instructions.   Mathis LITTIE Getting, CMA

## 2024-07-10 NOTE — Telephone Encounter (Signed)
 Spoke with patient Blood Pressure Elevated - OB  Currently pregnant   Any associated symptoms swelling of hands  Taking any blood pressure medications No patient is not currently on any BP meds   Checked BP at home  Yes- 150/85 BLOOD PRESSURE: High  Plan - Per Dr. Leigh patient scheduled for BP check today at 11:15.

## 2024-07-11 ENCOUNTER — Ambulatory Visit: Payer: Self-pay | Admitting: Obstetrics

## 2024-07-11 DIAGNOSIS — O132 Gestational [pregnancy-induced] hypertension without significant proteinuria, second trimester: Secondary | ICD-10-CM

## 2024-07-11 LAB — CBC WITH DIFFERENTIAL/PLATELET
Basophils Absolute: 0 x10E3/uL (ref 0.0–0.2)
Basos: 0 %
EOS (ABSOLUTE): 0.1 x10E3/uL (ref 0.0–0.4)
Eos: 1 %
Hematocrit: 34.5 % (ref 34.0–46.6)
Hemoglobin: 11.3 g/dL (ref 11.1–15.9)
Immature Grans (Abs): 0.1 x10E3/uL (ref 0.0–0.1)
Immature Granulocytes: 1 %
Lymphocytes Absolute: 1.5 x10E3/uL (ref 0.7–3.1)
Lymphs: 14 %
MCH: 29.2 pg (ref 26.6–33.0)
MCHC: 32.8 g/dL (ref 31.5–35.7)
MCV: 89 fL (ref 79–97)
Monocytes Absolute: 0.6 x10E3/uL (ref 0.1–0.9)
Monocytes: 6 %
Neutrophils Absolute: 8.3 x10E3/uL — ABNORMAL HIGH (ref 1.4–7.0)
Neutrophils: 78 %
Platelets: 192 x10E3/uL (ref 150–450)
RBC: 3.87 x10E6/uL (ref 3.77–5.28)
RDW: 13.3 % (ref 11.7–15.4)
WBC: 10.6 x10E3/uL (ref 3.4–10.8)

## 2024-07-11 LAB — COMPREHENSIVE METABOLIC PANEL WITH GFR
ALT: 6 IU/L (ref 0–32)
AST: 10 IU/L (ref 0–40)
Albumin: 3.6 g/dL — ABNORMAL LOW (ref 3.9–4.9)
Alkaline Phosphatase: 67 IU/L (ref 41–116)
BUN/Creatinine Ratio: 9 (ref 9–23)
BUN: 4 mg/dL — ABNORMAL LOW (ref 6–20)
Bilirubin Total: 0.2 mg/dL (ref 0.0–1.2)
CO2: 17 mmol/L — ABNORMAL LOW (ref 20–29)
Calcium: 8.7 mg/dL (ref 8.7–10.2)
Chloride: 105 mmol/L (ref 96–106)
Creatinine, Ser: 0.47 mg/dL — ABNORMAL LOW (ref 0.57–1.00)
Globulin, Total: 2.4 g/dL (ref 1.5–4.5)
Glucose: 67 mg/dL — ABNORMAL LOW (ref 70–99)
Potassium: 3.9 mmol/L (ref 3.5–5.2)
Sodium: 137 mmol/L (ref 134–144)
Total Protein: 6 g/dL (ref 6.0–8.5)
eGFR: 129 mL/min/1.73 (ref >59)

## 2024-07-11 LAB — PROTEIN / CREATININE RATIO, URINE
Creatinine, Urine: 47.6 mg/dL
Protein, Ur: 7.6 mg/dL
Protein/Creat Ratio: 160 mg/g{creat} (ref 0–200)

## 2024-07-17 ENCOUNTER — Ambulatory Visit (INDEPENDENT_AMBULATORY_CARE_PROVIDER_SITE_OTHER)

## 2024-07-17 ENCOUNTER — Other Ambulatory Visit

## 2024-07-17 VITALS — BP 139/95 | HR 103 | Wt 183.0 lb

## 2024-07-17 DIAGNOSIS — O132 Gestational [pregnancy-induced] hypertension without significant proteinuria, second trimester: Secondary | ICD-10-CM

## 2024-07-17 DIAGNOSIS — Z0130 Encounter for examination of blood pressure without abnormal findings: Secondary | ICD-10-CM

## 2024-07-17 NOTE — Progress Notes (Signed)
    NURSE VISIT NOTE  Subjective:    Patient ID: Sharon Gardner, female    DOB: 08/15/1991, 33 y.o.   MRN: [id number]  HPI  Patient is a 33 y.o. [phone] female who presents for BP check per order from Estil Mangle, MD.    BP Readings from Last 3 Encounters:  07/17/24 (!) 139/95  07/10/24 (!) 147/88  07/09/24 136/88   Pulse Readings from Last 3 Encounters:  07/17/24 (!) 103  07/10/24 98  07/09/24 91    Objective:    BP (!) 139/95   Pulse (!) 103   Wt 183 lb (83 kg)   LMP 01/07/2024 (Exact Date)   BMI 31.41 kg/m   Assessment:   1. Blood pressure check      Plan:   Per Dr. Mangle labs today Continue to monitor blood pressure at home. Report any reading >140/90 or with any associated symptoms. Return to clinic as scheduled. Return to clinic PRN Not actively on bp medication  Patient verbalized understanding of instructions.   Sharon Gardner, CMA

## 2024-07-18 LAB — CBC
Hematocrit: 34.7 % (ref 34.0–46.6)
Hemoglobin: 11.4 g/dL (ref 11.1–15.9)
MCH: 29.3 pg (ref 26.6–33.0)
MCHC: 32.9 g/dL (ref 31.5–35.7)
MCV: 89 fL (ref 79–97)
Platelets: 192 x10E3/uL (ref 150–450)
RBC: 3.89 x10E6/uL (ref 3.77–5.28)
RDW: 13 % (ref 11.7–15.4)
WBC: 9.4 x10E3/uL (ref 3.4–10.8)

## 2024-07-18 LAB — COMPREHENSIVE METABOLIC PANEL WITH GFR
ALT: 6 IU/L (ref 0–32)
AST: 16 IU/L (ref 0–40)
Albumin: 3.7 g/dL — ABNORMAL LOW (ref 3.9–4.9)
Alkaline Phosphatase: 75 IU/L (ref 41–116)
BUN/Creatinine Ratio: 9 (ref 9–23)
BUN: 6 mg/dL (ref 6–20)
Bilirubin Total: <0.2 mg/dL (ref 0.0–1.2)
CO2: 18 mmol/L — ABNORMAL LOW (ref 20–29)
Calcium: 9.5 mg/dL (ref 8.7–10.2)
Chloride: 103 mmol/L (ref 96–106)
Creatinine, Ser: 0.68 mg/dL (ref 0.57–1.00)
Globulin, Total: 2.4 g/dL (ref 1.5–4.5)
Glucose: 76 mg/dL (ref 70–99)
Potassium: 4 mmol/L (ref 3.5–5.2)
Sodium: 136 mmol/L (ref 134–144)
Total Protein: 6.1 g/dL (ref 6.0–8.5)
eGFR: 118 mL/min/1.73 (ref >59)

## 2024-07-18 LAB — PROTEIN / CREATININE RATIO, URINE
Creatinine, Urine: 77.1 mg/dL
Protein, Ur: 21.8 mg/dL
Protein/Creat Ratio: 283 mg/g{creat} — ABNORMAL HIGH (ref 0–200)

## 2024-07-19 ENCOUNTER — Encounter: Payer: Self-pay | Admitting: Obstetrics

## 2024-07-19 ENCOUNTER — Ambulatory Visit: Payer: Self-pay | Admitting: Obstetrics

## 2024-07-23 NOTE — Patient Instructions (Signed)
 Third Trimester of Pregnancy  The third trimester of pregnancy is from week 28 through week 40. This is months 7 through 9. The third trimester is a time when your baby is growing fast. Body changes during your third trimester Your body continues to change during this time. The changes usually go away after your baby is born. Physical changes You will continue to gain weight. You may get stretch marks on your hips, belly, and breasts. Your breasts will keep growing and may hurt. A yellow fluid (colostrum) may leak from your breasts. This is the first milk you're making for your baby. Your hair may grow faster and get thicker. In some cases, you may get hair loss. Your belly button may stick out. You may have more swelling in your hands, face, or ankles. Health changes You may have heartburn. You may feel short of breath. This is caused by the uterus that is now bigger. You may have more aches in the pelvis, back, or thighs. You may have more tingling or numbness in your hands, arms, and legs. You may pee more often. You may have trouble pooping (constipation) or swollen veins in the butt that can itch or get painful (hemorrhoids). Other changes You may have more problems sleeping. You may notice the baby moving lower in your belly (dropping). You may have more fluid coming from your vagina. Your joints may feel loose, and you may have pain around your pelvic bone. Follow these instructions at home: Medicines Take medicines only as told by your health care provider. Some medicines are not safe during pregnancy. Your provider may change the medicines that you take. Do not take any medicines unless told to by your provider. Take a prenatal vitamin that has at least 600 micrograms (mcg) of folic acid. Do not use herbal medicines, illegal drugs, or medicines that are not approved by your provider. Eating and drinking While you're pregnant your body needs additional nutrition to help  support your growing baby. Talk with your provider about your nutritional needs. Activity Most women are able to exercise regularly during pregnancy. Exercise routines may need to change at the end of your pregnancy. Talk to your provider about your activities and exercise routine. Relieving pain and discomfort Rest often with your legs raised if you have leg cramps or low back pain. Take warm sitz baths to soothe pain from hemorrhoids. Use hemorrhoid cream if your provider says it's okay. Wear a good, supportive bra if your breasts hurt. Do not use hot tubs, steam rooms, or saunas. Do not douche. Do not use tampons or scented pads. Safety Talk to your provider before traveling far distances. Wear your seatbelt at all times when you're in a car. Talk to your provider if someone hits you, hurts you, or yells at you. Preparing for birth To prepare for your baby: Take childbirth and breastfeeding classes. Visit the hospital and tour the maternity area. Buy a rear-facing car seat. Learn how to install it in your car. General instructions Avoid cat litter boxes and soil used by cats. These things carry germs that can cause harm to your pregnancy and your baby. Do not drink alcohol, smoke, vape, or use products with nicotine or tobacco in them. If you need help quitting, talk with your provider. Keep all follow-up visits for your third trimester. Your provider will do more exams and tests during this trimester. Write down your questions. Take them to your prenatal visits. Your provider also will: Talk with you about  your overall health. Give you advice or refer you to specialists who can help with different needs, including: Mental health and counseling. Foods and healthy eating. Ask for help if you need help with food. Where to find more information American Pregnancy Association: americanpregnancy.org [employer]ologists: acog.org Office on [employer] Health:  [employer] Contact a health care provider if: You have a headache that does not go away when you take medicine. You have any of these problems: You can't eat or drink. You have nausea and vomiting. You have watery poop (diarrhea) for 2 days or more. You have pain when you pee, or your pee smells bad. You have been sick for 2 days or more and aren't getting better. Contact your provider right away if: You have any of these coming from your vagina: Abnormal discharge. Bad-smelling fluid. Bleeding. Your baby is moving less than usual. You have signs of labor: You have any contractions, belly cramping, or have pain in your pelvis or lower back before 37 weeks of pregnancy (preterm labor). You have regular contractions that are less than 5 minutes apart. Your water breaks. You have symptoms of high blood pressure or preeclampsia. These include: A severe, throbbing headache that does not go away. Sudden or extreme swelling of your face, hands, legs, or feet. Vision problems: You see spots. You have blurry vision. Your eyes are sensitive to light. If you can't reach your provider, go to an urgent care or emergency room. Get help right away if: You faint, become confused, or can't think clearly. You have chest pain or trouble breathing. You have any kind of injury, such as from a fall or a car crash. These symptoms may be an emergency. Call 911 right away. Do not wait to see if the symptoms will go away. Do not drive yourself to the hospital. This information is not intended to replace advice given to you by your health care provider. Make sure you discuss any questions you have with your health care provider. Document Revised: 06/09/2023 Document Reviewed: 01/07/2023 Elsevier Patient Education  2024 [employer].

## 2024-07-24 ENCOUNTER — Ambulatory Visit: Admitting: Obstetrics

## 2024-07-24 ENCOUNTER — Other Ambulatory Visit: Payer: Self-pay | Admitting: Obstetrics

## 2024-07-24 ENCOUNTER — Other Ambulatory Visit

## 2024-07-24 ENCOUNTER — Encounter: Payer: Self-pay | Admitting: Obstetrics

## 2024-07-24 ENCOUNTER — Other Ambulatory Visit: Payer: Self-pay

## 2024-07-24 VITALS — BP 144/93 | HR 103 | Wt 188.2 lb

## 2024-07-24 DIAGNOSIS — Z3A28 28 weeks gestation of pregnancy: Secondary | ICD-10-CM

## 2024-07-24 DIAGNOSIS — O34211 Maternal care for low transverse scar from previous cesarean delivery: Secondary | ICD-10-CM | POA: Diagnosis not present

## 2024-07-24 DIAGNOSIS — E669 Obesity, unspecified: Secondary | ICD-10-CM | POA: Diagnosis not present

## 2024-07-24 DIAGNOSIS — Z23 Encounter for immunization: Secondary | ICD-10-CM

## 2024-07-24 DIAGNOSIS — O132 Gestational [pregnancy-induced] hypertension without significant proteinuria, second trimester: Secondary | ICD-10-CM

## 2024-07-24 DIAGNOSIS — O99213 Obesity complicating pregnancy, third trimester: Secondary | ICD-10-CM

## 2024-07-24 DIAGNOSIS — O99210 Obesity complicating pregnancy, unspecified trimester: Secondary | ICD-10-CM

## 2024-07-24 DIAGNOSIS — O133 Gestational [pregnancy-induced] hypertension without significant proteinuria, third trimester: Secondary | ICD-10-CM

## 2024-07-24 DIAGNOSIS — Z113 Encounter for screening for infections with a predominantly sexual mode of transmission: Secondary | ICD-10-CM

## 2024-07-24 DIAGNOSIS — Z98891 History of uterine scar from previous surgery: Secondary | ICD-10-CM

## 2024-07-24 DIAGNOSIS — Z130 Encounter for screening for diseases of the blood and blood-forming organs and certain disorders involving the immune mechanism: Secondary | ICD-10-CM

## 2024-07-24 DIAGNOSIS — O0990 Supervision of high risk pregnancy, unspecified, unspecified trimester: Secondary | ICD-10-CM

## 2024-07-24 DIAGNOSIS — Z131 Encounter for screening for diabetes mellitus: Secondary | ICD-10-CM

## 2024-07-24 NOTE — Progress Notes (Signed)
    Return Prenatal Note   Subjective  33 y.o. H5E7987 at [redacted]w[redacted]d presents for this follow-up prenatal visit. Pregnancy notable for gHTN dx at 26wks with hx of gHTN in prior 2 pregnancies, prior CD x 2 desiring repeat, and elevated BMI in pregnancy.   Patient well today, had HA over the wknd that resolved after sleep. Is otherwise asymptomatic for si/sx of preeclampsia and BP at home has been in mild range. Weekly BP/lab checks here reassuring, but most recent UP:C 283.   Would like to schedule rCD, does not want tubal. Is most likely considering a 4th baby, husband wants a boy.   Patient reports: Movement: Present Contractions: Irregular Denies vaginal bleeding or leaking fluid. Objective  Flow sheet Vitals: Pulse Rate: (!) 103 BP: (!) 144/93 Fundal Height: 28 cm Fetal Heart Rate (bpm): 152 Total weight gain: 6 lb 3.2 oz (2.812 kg)  General Appearance  No acute distress, well appearing, and well nourished Pulmonary   Normal work of breathing Neurologic   Alert and oriented to person, place, and time Psychiatric   Mood and affect within normal limits   Assessment/Plan   Plan  33 y.o. H5E7987 at [redacted]w[redacted]d by LMP=10wk US  presents for follow-up OB visit. Reviewed prenatal record including previous visit note.  1. Supervision of high risk pregnancy, antepartum (Primary) -1hGTT, 28wk labs, Tdap, and BTC done in clinic today  2. Pregnancy-induced hypertension in third trimester -Continue ASA -Growth US  in 2wks, then Q4wks -Weekly NST starting next week -- can do with weekly BP/lab check -Reviewed si/sx of preeclampsia extensively -Delivery at 37wks, discussed timing subject to change based on clinical course  3. S/P primary low transverse C-section -Desiring repeat, scheduled for 09/26/23  4. Obesity in pregnancy, antepartum -PG-BMI 31.22, TWG 6#, appropriate  Orders Placed This Encounter  Procedures   US  OB Follow Up    Standing Status:   Future    Expected Date:    08/07/2024    Expiration Date:   07/24/2025    Reason for exam::   Growth for gestational HTN    Preferred imaging location?:   Internal   Tdap vaccine greater than or equal to 7yo IM    Future Appointments  Date Time Provider Department Center  07/31/2024 10:15 AM AOB-NURSE [facility] None  07/31/2024 11:00 AM AOB-OBGYN LAB [facility] None  08/07/2024  9:15 AM AOB-AOB US  1 AOB-IMG None  08/07/2024  9:55 AM Slaughterbeck, Damien, CNM [facility]OB-[facility] None    Return in about 1 week (around 07/31/2024) for BP/labs/NST; 2wks ROB w/growth US .     Estil Mangle, DO [facility]

## 2024-07-25 ENCOUNTER — Ambulatory Visit: Payer: Self-pay | Admitting: Obstetrics

## 2024-07-25 ENCOUNTER — Other Ambulatory Visit: Payer: Self-pay | Admitting: Obstetrics

## 2024-07-25 DIAGNOSIS — R7302 Impaired glucose tolerance (oral): Secondary | ICD-10-CM

## 2024-07-25 LAB — COMPREHENSIVE METABOLIC PANEL WITH GFR
ALT: 9 IU/L (ref 0–32)
AST: 13 IU/L (ref 0–40)
Albumin: 3.5 g/dL — ABNORMAL LOW (ref 3.9–4.9)
Alkaline Phosphatase: 74 IU/L (ref 41–116)
BUN/Creatinine Ratio: 8 — ABNORMAL LOW (ref 9–23)
BUN: 5 mg/dL — ABNORMAL LOW (ref 6–20)
Bilirubin Total: <0.2 mg/dL (ref 0.0–1.2)
CO2: 18 mmol/L — ABNORMAL LOW (ref 20–29)
Calcium: 8.5 mg/dL — ABNORMAL LOW (ref 8.7–10.2)
Chloride: 104 mmol/L (ref 96–106)
Creatinine, Ser: 0.6 mg/dL (ref 0.57–1.00)
Globulin, Total: 2.7 g/dL (ref 1.5–4.5)
Glucose: 140 mg/dL — ABNORMAL HIGH (ref 70–99)
Potassium: 3.6 mmol/L (ref 3.5–5.2)
Sodium: 139 mmol/L (ref 134–144)
Total Protein: 6.2 g/dL (ref 6.0–8.5)
eGFR: 121 mL/min/1.73 (ref >59)

## 2024-07-25 LAB — 28 WEEK RH+PANEL
Basophils Absolute: 0 x10E3/uL (ref 0.0–0.2)
Basos: 0 %
EOS (ABSOLUTE): 0.1 x10E3/uL (ref 0.0–0.4)
Eos: 1 %
Gestational Diabetes Screen: 151 mg/dL — ABNORMAL HIGH (ref 70–139)
HIV Screen 4th Generation wRfx: NONREACTIVE
Hematocrit: 33.4 % — ABNORMAL LOW (ref 34.0–46.6)
Hemoglobin: 11.3 g/dL (ref 11.1–15.9)
Immature Grans (Abs): 0.1 x10E3/uL (ref 0.0–0.1)
Immature Granulocytes: 1 %
Lymphocytes Absolute: 1.5 x10E3/uL (ref 0.7–3.1)
Lymphs: 14 %
MCH: 29.5 pg (ref 26.6–33.0)
MCHC: 33.8 g/dL (ref 31.5–35.7)
MCV: 87 fL (ref 79–97)
Monocytes Absolute: 0.5 x10E3/uL (ref 0.1–0.9)
Monocytes: 5 %
Neutrophils Absolute: 8.3 x10E3/uL — ABNORMAL HIGH (ref 1.4–7.0)
Neutrophils: 79 %
Platelets: 181 x10E3/uL (ref 150–450)
RBC: 3.83 x10E6/uL (ref 3.77–5.28)
RDW: 13 % (ref 11.7–15.4)
RPR Ser Ql: NONREACTIVE
WBC: 10.5 x10E3/uL (ref 3.4–10.8)

## 2024-07-25 LAB — PROTEIN / CREATININE RATIO, URINE
Creatinine, Urine: 96.2 mg/dL
Protein, Ur: 23.8 mg/dL
Protein/Creat Ratio: 247 mg/g{creat} — ABNORMAL HIGH (ref 0–200)

## 2024-07-25 NOTE — Progress Notes (Signed)
 1hGTT 151 Ordering 3hGTT

## 2024-07-27 ENCOUNTER — Encounter: Payer: Self-pay | Admitting: Obstetrics

## 2024-07-27 ENCOUNTER — Other Ambulatory Visit

## 2024-07-27 DIAGNOSIS — R7302 Impaired glucose tolerance (oral): Secondary | ICD-10-CM

## 2024-07-28 LAB — GESTATIONAL GLUCOSE TOLERANCE
Glucose, Fasting: 73 mg/dL (ref 70–94)
Glucose, GTT - 1 Hour: 128 mg/dL (ref 70–179)
Glucose, GTT - 2 Hour: 142 mg/dL (ref 70–154)
Glucose, GTT - 3 Hour: 125 mg/dL (ref 70–139)

## 2024-07-30 ENCOUNTER — Ambulatory Visit: Payer: Self-pay | Admitting: Obstetrics

## 2024-07-30 NOTE — Progress Notes (Unsigned)
    NURSE VISIT NOTE  Subjective:    Patient ID: Sharon Gardner, female    DOB: [birth year], 33 y.o.   MRN: [id number]  HPI  Patient is a 33 y.o. [phone] female who presents for BP check per order from Estil Mangle, MD.   Patient reports compliance with prescribed BP medications: no   Last dose of BP medication:    BP Readings from Last 3 Encounters:  07/31/24 (!) 142/87  07/24/24 (!) 144/93  07/17/24 (!) 139/95   Pulse Readings from Last 3 Encounters:  07/31/24 94  07/24/24 (!) 103  07/17/24 (!) 103    Objective:    BP (!) 142/87 (BP Location: Right Arm, Patient Position: Sitting, Cuff Size: Normal)   Pulse 94   Wt 187 lb 1.6 oz (84.9 kg)   LMP 01/07/2024 (Exact Date)   BMI 32.12 kg/m   Assessment:   1. Blood pressure check   2. Pregnancy-induced hypertension in second trimester      Plan:   Per Dr. Estil Mangle, MD:  Return to clinic as scheduled. Also got labs done today/  Patient verbalized understanding of instructions.   Burnard LITTIE Ro, CMA

## 2024-07-31 ENCOUNTER — Other Ambulatory Visit

## 2024-07-31 ENCOUNTER — Ambulatory Visit (INDEPENDENT_AMBULATORY_CARE_PROVIDER_SITE_OTHER)

## 2024-07-31 VITALS — BP 137/85 | HR 89 | Wt 187.1 lb

## 2024-07-31 DIAGNOSIS — O132 Gestational [pregnancy-induced] hypertension without significant proteinuria, second trimester: Secondary | ICD-10-CM

## 2024-07-31 DIAGNOSIS — Z0130 Encounter for examination of blood pressure without abnormal findings: Secondary | ICD-10-CM

## 2024-08-01 DIAGNOSIS — Z0289 Encounter for other administrative examinations: Secondary | ICD-10-CM

## 2024-08-01 LAB — COMPREHENSIVE METABOLIC PANEL WITH GFR
ALT: 7 IU/L (ref 0–32)
AST: 17 IU/L (ref 0–40)
Albumin: 3.8 g/dL — ABNORMAL LOW (ref 3.9–4.9)
Alkaline Phosphatase: 80 IU/L (ref 41–116)
BUN/Creatinine Ratio: 10 (ref 9–23)
BUN: 6 mg/dL (ref 6–20)
Bilirubin Total: 0.3 mg/dL (ref 0.0–1.2)
CO2: 18 mmol/L — ABNORMAL LOW (ref 20–29)
Calcium: 9.1 mg/dL (ref 8.7–10.2)
Chloride: 103 mmol/L (ref 96–106)
Creatinine, Ser: 0.62 mg/dL (ref 0.57–1.00)
Globulin, Total: 2.5 g/dL (ref 1.5–4.5)
Glucose: 80 mg/dL (ref 70–99)
Potassium: 4 mmol/L (ref 3.5–5.2)
Sodium: 137 mmol/L (ref 134–144)
Total Protein: 6.3 g/dL (ref 6.0–8.5)
eGFR: 121 mL/min/1.73 (ref >59)

## 2024-08-01 LAB — CBC
Hematocrit: 34 % (ref 34.0–46.6)
Hemoglobin: 11.1 g/dL (ref 11.1–15.9)
MCH: 28.6 pg (ref 26.6–33.0)
MCHC: 32.6 g/dL (ref 31.5–35.7)
MCV: 88 fL (ref 79–97)
Platelets: 188 x10E3/uL (ref 150–450)
RBC: 3.88 x10E6/uL (ref 3.77–5.28)
RDW: 13.2 % (ref 11.7–15.4)
WBC: 10.4 x10E3/uL (ref 3.4–10.8)

## 2024-08-01 LAB — PROTEIN / CREATININE RATIO, URINE
Creatinine, Urine: 135.6 mg/dL
Protein, Ur: 25.1 mg/dL
Protein/Creat Ratio: 185 mg/g{creat} (ref 0–200)

## 2024-08-02 ENCOUNTER — Ambulatory Visit: Payer: Self-pay | Admitting: Obstetrics

## 2024-08-06 NOTE — Progress Notes (Unsigned)
    Return Prenatal Note   Subjective   33 y.o. H5E7987 at [redacted]w[redacted]d presents for this follow-up prenatal visit.  Patient is doing well. She reports good fetal movement with no new concerns today. Ultrasound and NST done today. Patient reports: Movement: Present Contractions: Not present  Objective   Flow sheet Vitals: Pulse Rate: (!) 111 BP: (!) 147/89 Total weight gain: 5 lb 14.4 oz (2.676 kg)  AURIANNA EARLYWINE [birth year] [redacted]w[redacted]d  Fetus A Non-Stress Test Interpretation for 08/07/24  Indication: GHTN  Fetal Heart Rate A Baseline Rate (A): 140 bpm Variability: Moderate Accelerations: 15 x 15 Decelerations: None     Interpretation (Fetal Testing) Nonstress Test Interpretation: Reactive Overall Impression: Reassuring for gestational age    General Appearance  No acute distress, well appearing, and well nourished Pulmonary   Normal work of breathing Neurologic   Alert and oriented to person, place, and time Psychiatric   Mood and affect within normal limits   Assessment/Plan   Plan  33 y.o. H5E7987 at [redacted]w[redacted]d presents for follow-up OB visit. Reviewed prenatal record including previous visit note.  Pregnancy-induced hypertension Reactive NST today.  Labs drawn Growth US  today- EFW 69.9%, AC 78.5%, AFI-15, cephalic Continues taking daily BP at home.  Reviewed signs and symptoms of pre-eclampsia Continue weekly NST/labs  Supervision of high risk pregnancy, antepartum Feeling well with no concerns today. Reviewed kick counts and preterm labor warning signs. Instructed to call office or come to hospital with persistent headache, vision changes, regular contractions, leaking of fluid, decreased fetal movement or vaginal bleeding.       Orders Placed This Encounter  Procedures   CBC   COMPLETE METABOLIC PANEL WITHOUT GFR   Protein / creatinine ratio, urine   CBC   Comp Met (CMET)   Fetal nonstress test   No follow-ups on file.   No future  appointments.   For next visit:  continue with routine prenatal care

## 2024-08-07 ENCOUNTER — Encounter: Payer: Self-pay | Admitting: Certified Nurse Midwife

## 2024-08-07 ENCOUNTER — Ambulatory Visit

## 2024-08-07 ENCOUNTER — Ambulatory Visit (INDEPENDENT_AMBULATORY_CARE_PROVIDER_SITE_OTHER): Admitting: Certified Nurse Midwife

## 2024-08-07 VITALS — BP 147/89 | HR 111 | Wt 187.9 lb

## 2024-08-07 DIAGNOSIS — O133 Gestational [pregnancy-induced] hypertension without significant proteinuria, third trimester: Secondary | ICD-10-CM

## 2024-08-07 DIAGNOSIS — Z3A30 30 weeks gestation of pregnancy: Secondary | ICD-10-CM

## 2024-08-07 DIAGNOSIS — Z3A31 31 weeks gestation of pregnancy: Secondary | ICD-10-CM

## 2024-08-07 DIAGNOSIS — Z8759 Personal history of other complications of pregnancy, childbirth and the puerperium: Secondary | ICD-10-CM

## 2024-08-07 DIAGNOSIS — O0990 Supervision of high risk pregnancy, unspecified, unspecified trimester: Secondary | ICD-10-CM

## 2024-08-07 NOTE — Assessment & Plan Note (Addendum)
 Reactive NST today.  Labs drawn Growth US  today- EFW 69.9%, AC 78.5%, AFI-15, cephalic Continues taking daily BP at home.  Reviewed signs and symptoms of pre-eclampsia Continue weekly NST/labs

## 2024-08-07 NOTE — Assessment & Plan Note (Signed)
 Feeling well with no concerns today. Reviewed kick counts and preterm labor warning signs. Instructed to call office or come to hospital with persistent headache, vision changes, regular contractions, leaking of fluid, decreased fetal movement or vaginal bleeding.

## 2024-08-07 NOTE — Assessment & Plan Note (Signed)
" >>  ASSESSMENT AND PLAN FOR MILD PREECLAMPSIA WRITTEN ON 08/07/2024 10:43 AM BY Leaner Morici, CNM  Reactive NST today.  Labs drawn Growth US  today- EFW 69.9%, AC 78.5%, AFI-15, cephalic Continues taking daily BP at home.  Reviewed signs and symptoms of pre-eclampsia Continue weekly NST/labs "

## 2024-08-08 LAB — COMPREHENSIVE METABOLIC PANEL WITH GFR
ALT: 5 IU/L (ref 0–32)
AST: 14 IU/L (ref 0–40)
Albumin: 3.5 g/dL — ABNORMAL LOW (ref 3.9–4.9)
Alkaline Phosphatase: 87 IU/L (ref 41–116)
BUN/Creatinine Ratio: 11 (ref 9–23)
BUN: 6 mg/dL (ref 6–20)
Bilirubin Total: <0.2 mg/dL (ref 0.0–1.2)
CO2: 17 mmol/L — ABNORMAL LOW (ref 20–29)
Calcium: 9.5 mg/dL (ref 8.7–10.2)
Chloride: 104 mmol/L (ref 96–106)
Creatinine, Ser: 0.57 mg/dL (ref 0.57–1.00)
Globulin, Total: 2.5 g/dL (ref 1.5–4.5)
Glucose: 76 mg/dL (ref 70–99)
Potassium: 4 mmol/L (ref 3.5–5.2)
Sodium: 137 mmol/L (ref 134–144)
Total Protein: 6 g/dL (ref 6.0–8.5)
eGFR: 123 mL/min/1.73 (ref >59)

## 2024-08-08 LAB — CBC
Hematocrit: 33.2 % — ABNORMAL LOW (ref 34.0–46.6)
Hemoglobin: 11 g/dL — ABNORMAL LOW (ref 11.1–15.9)
MCH: 29.2 pg (ref 26.6–33.0)
MCHC: 33.1 g/dL (ref 31.5–35.7)
MCV: 88 fL (ref 79–97)
Platelets: 176 x10E3/uL (ref 150–450)
RBC: 3.77 x10E6/uL (ref 3.77–5.28)
RDW: 13.1 % (ref 11.7–15.4)
WBC: 11.9 x10E3/uL — ABNORMAL HIGH (ref 3.4–10.8)

## 2024-08-08 LAB — PROTEIN / CREATININE RATIO, URINE
Creatinine, Urine: 101.1 mg/dL
Protein, Ur: 17.2 mg/dL
Protein/Creat Ratio: 170 mg/g{creat} (ref 0–200)

## 2024-08-14 ENCOUNTER — Other Ambulatory Visit

## 2024-08-14 ENCOUNTER — Ambulatory Visit (INDEPENDENT_AMBULATORY_CARE_PROVIDER_SITE_OTHER)

## 2024-08-14 VITALS — BP 137/86 | HR 96 | Ht 64.0 in | Wt 186.2 lb

## 2024-08-14 DIAGNOSIS — Z3A31 31 weeks gestation of pregnancy: Secondary | ICD-10-CM

## 2024-08-14 DIAGNOSIS — O132 Gestational [pregnancy-induced] hypertension without significant proteinuria, second trimester: Secondary | ICD-10-CM

## 2024-08-14 DIAGNOSIS — O133 Gestational [pregnancy-induced] hypertension without significant proteinuria, third trimester: Secondary | ICD-10-CM | POA: Diagnosis not present

## 2024-08-14 NOTE — Patient Instructions (Signed)
 Nonstress Test: What to Expect A nonstress test, also called an NST, is done during pregnancy to check your baby's heartbeat. The procedure can help to show if your baby is healthy. It may be done if: Your due date has passed. Your pregnancy is high risk. Your baby is moving less than normal. You've lost a previous pregnancy. Your baby is growing slowly. There's too much or too little fluid around your baby. The NST may be done in the third trimester to find out if it's best for your baby to be born early. During an NST, your baby's heartbeat is watched for at least 20 minutes. If the baby is healthy, the heart rate will go up when the baby moves and will return to normal when the baby rests. This should happen at least twice during the test. Tell a health care provider about: Any allergies you have. Any medical problems you have. All medicines you take. These include vitamins, herbs, eye drops, and creams. Any surgeries you've had. Any past pregnancies you've had. What are the risks? There are no risks to you or your baby from a nonstress test. This procedure shouldn't be painful or uncomfortable. What happens before? Eat a meal right before the test or as told by your health care team. Food may help the baby to move. Use the restroom right before the test. What happens during a nonstress test?  Two monitors will be placed on your belly. One will check your baby's heart rate, and the other will check for contractions. You may be asked to lie down on your side or to sit up. You may be given a button to press when you feel your baby move. If your baby seems to be sleeping, you may be asked to drink some juice or soda, eat a snack, or change positions. These steps may vary. Ask what you can expect. What can I expect after? Your team will talk with you about the results and tell you the next steps. If your team gave you any diet or activity instructions, make sure to follow them. Keep all  follow-up visits. This is important to check on your health and the health of your baby. This information is not intended to replace advice given to you by your health care provider. Make sure you discuss any questions you have with your health care provider. Document Revised: 09/01/2023 Document Reviewed: 09/01/2023 Elsevier Patient Education  2025 [employer].

## 2024-08-14 NOTE — Progress Notes (Signed)
    NURSE VISIT NOTE  Subjective:    Patient ID: Sharon Gardner, female    DOB: [birth year], 33 y.o.   MRN: [id number]  HPI  Patient is a 33 y.o. [phone] female who presents for fetal monitoring per order from [facility], CNM.   Objective:    BP 137/86   Pulse 96   Ht 5' 4 (1.626 m)   Wt 186 lb 3.2 oz (84.5 kg)   LMP 01/07/2024 (Exact Date)   BMI 31.96 kg/m  Estimated Date of Delivery: 10/13/24  [redacted]w[redacted]d  Fetus A Non-Stress Test Interpretation for 08/14/24  Indication: Gestational Hypertension  Fetal Heart Rate A Mode: External Baseline Rate (A): 145 bpm Variability: Moderate Accelerations: 15 x 15 Decelerations: None Multiple birth?: No  Uterine Activity Mode: Toco Contraction Frequency (min): Rare-1 noted Contraction Duration (sec): 60 Contraction Quality: Mild Resting Time: Adequate  Interpretation (Fetal Testing) Nonstress Test Interpretation: Reactive Overall Impression: Reassuring for gestational age   Assessment:   1. Pregnancy-induced hypertension in third trimester   2. [redacted] weeks gestation of pregnancy      Plan:   Results reviewed by Harlene Cisco, CNM and discussed with patient.     Camelia Bars, LPN

## 2024-08-15 LAB — COMPREHENSIVE METABOLIC PANEL WITH GFR
ALT: 6 IU/L (ref 0–32)
AST: 18 IU/L (ref 0–40)
Albumin: 3.5 g/dL — ABNORMAL LOW (ref 3.9–4.9)
Alkaline Phosphatase: 100 IU/L (ref 41–116)
BUN/Creatinine Ratio: 8 — ABNORMAL LOW (ref 9–23)
BUN: 5 mg/dL — ABNORMAL LOW (ref 6–20)
Bilirubin Total: 0.2 mg/dL (ref 0.0–1.2)
CO2: 16 mmol/L — ABNORMAL LOW (ref 20–29)
Calcium: 8.7 mg/dL (ref 8.7–10.2)
Chloride: 103 mmol/L (ref 96–106)
Creatinine, Ser: 0.65 mg/dL (ref 0.57–1.00)
Globulin, Total: 2.6 g/dL (ref 1.5–4.5)
Glucose: 99 mg/dL (ref 70–99)
Potassium: 3.5 mmol/L (ref 3.5–5.2)
Sodium: 137 mmol/L (ref 134–144)
Total Protein: 6.1 g/dL (ref 6.0–8.5)
eGFR: 119 mL/min/1.73 (ref >59)

## 2024-08-15 LAB — PROTEIN / CREATININE RATIO, URINE
Creatinine, Urine: 95.8 mg/dL
Protein, Ur: 23.5 mg/dL
Protein/Creat Ratio: 245 mg/g{creat} — ABNORMAL HIGH (ref 0–200)

## 2024-08-21 ENCOUNTER — Ambulatory Visit: Payer: Self-pay | Admitting: Obstetrics

## 2024-08-21 ENCOUNTER — Ambulatory Visit: Admitting: Certified Nurse Midwife

## 2024-08-21 ENCOUNTER — Other Ambulatory Visit

## 2024-08-21 VITALS — BP 149/93 | HR 98 | Wt 189.0 lb

## 2024-08-21 DIAGNOSIS — O34219 Maternal care for unspecified type scar from previous cesarean delivery: Secondary | ICD-10-CM | POA: Diagnosis not present

## 2024-08-21 DIAGNOSIS — Z3A32 32 weeks gestation of pregnancy: Secondary | ICD-10-CM

## 2024-08-21 DIAGNOSIS — O133 Gestational [pregnancy-induced] hypertension without significant proteinuria, third trimester: Secondary | ICD-10-CM | POA: Diagnosis not present

## 2024-08-21 DIAGNOSIS — O0990 Supervision of high risk pregnancy, unspecified, unspecified trimester: Secondary | ICD-10-CM

## 2024-08-21 NOTE — Progress Notes (Signed)
    Return Prenatal Note   Subjective   33 y.o. H5E7987 at [redacted]w[redacted]d presents for this follow-up prenatal visit.  Patient feeling well, active baby. Denies HA/visual changes/RUQ pain. Has not taken BP at home in the past week.  Patient reports: Movement: Present Contractions: Irritability  Objective   Flow sheet Vitals: Pulse Rate: 98 BP: (!) 149/93 Fundal Height: 33 cm Fetal Heart Rate (bpm): 145 Total weight gain: 7 lb (3.175 kg)  General Appearance  No acute distress, well appearing, and well nourished Pulmonary   Normal work of breathing Neurologic   Alert and oriented to person, place, and time Psychiatric   Mood and affect within normal limits  Fetus A Non-Stress Test Interpretation for 08/21/24  Indication: GHTN  Fetal Heart Rate A Mode: External Baseline Rate (A): 145 bpm Variability: Moderate Multiple birth?: No  Uterine Activity Mode: Palpation, Toco Contraction Frequency (min): none noted Resting Tone Palpated: Relaxed Resting Time: Adequate  Interpretation (Fetal Testing) Overall Impression: Reassuring for gestational age    Assessment/Plan   Plan  33 y.o. H5E7987 at [redacted]w[redacted]d presents for follow-up OB visit. Reviewed prenatal record including previous visit note.  Gestational hypertension RNST. CBC/CMP & UP/C collected MRBP. Asymptomatic. NST next week, then growth u/s with BPP following week. Continue weekly testing with labs. Precautions reinforced.  Previous cesarean delivery affecting pregnancy, antepartum rCD scheduled 09/25/24      Orders Placed This Encounter  Procedures   US  OB Follow Up    Standing Status:   Future    Expected Date:   09/04/2024    Expiration Date:   08/21/2025    Reason for exam::   growth, GHTN    Preferred imaging location?:   Internal   US  FETAL BPP WO NON STRESS    Standing Status:   Future    Expected Date:   09/04/2024    Expiration Date:   08/21/2025    Reason for exam::   with growth u/s    Preferred  imaging location?:   Internal   Respiratory syncytial virus vaccine, preF, subunit, bivalent,(Abrysvo)   CBC   Comprehensive metabolic panel with GFR   Protein / creatinine ratio, urine   Fetal nonstress test   Return in 1 week (on 08/28/2024) for ROB & NST.   Future Appointments  Date Time Provider Department Center  08/28/2024  9:15 AM AOB-NST ROOM [facility]OB-AOB None  09/04/2024  9:15 AM AOB-NST ROOM AOB-AOB None  09/04/2024  9:55 AM Slaughterbeck, Damien, CNM AOB-AOB None  09/11/2024  9:15 AM AOB-NST ROOM [facility]OB-AOB None  09/18/2024  9:15 AM AOB-NST ROOM [facility]-AOB None  09/18/2024  9:35 AM Jayne Harlene CROME, CNM AOB-AOB None    For next visit:  ROB with NST     Harlene CROME Jayne, CNM  08/21/2509:10 AM

## 2024-08-21 NOTE — Assessment & Plan Note (Signed)
 rCD scheduled 09/25/24

## 2024-08-21 NOTE — Patient Instructions (Signed)
 Third Trimester of Pregnancy  The third trimester of pregnancy is from week 28 through week 40. This is months 7 through 9. The third trimester is a time when your baby is growing fast. Body changes during your third trimester Your body continues to change during this time. The changes usually go away after your baby is born. Physical changes You will continue to gain weight. You may get stretch marks on your hips, belly, and breasts. Your breasts will keep growing and may hurt. A yellow fluid (colostrum) may leak from your breasts. This is the first milk you're making for your baby. Your hair may grow faster and get thicker. In some cases, you may get hair loss. Your belly button may stick out. You may have more swelling in your hands, face, or ankles. Health changes You may have heartburn. You may feel short of breath. This is caused by the uterus that is now bigger. You may have more aches in the pelvis, back, or thighs. You may have more tingling or numbness in your hands, arms, and legs. You may pee more often. You may have trouble pooping (constipation) or swollen veins in the butt that can itch or get painful (hemorrhoids). Other changes You may have more problems sleeping. You may notice the baby moving lower in your belly (dropping). You may have more fluid coming from your vagina. Your joints may feel loose, and you may have pain around your pelvic bone. Follow these instructions at home: Medicines Take medicines only as told by your health care provider. Some medicines are not safe during pregnancy. Your provider may change the medicines that you take. Do not take any medicines unless told to by your provider. Take a prenatal vitamin that has at least 600 micrograms (mcg) of folic acid. Do not use herbal medicines, illegal drugs, or medicines that are not approved by your provider. Eating and drinking While you're pregnant your body needs additional nutrition to help  support your growing baby. Talk with your provider about your nutritional needs. Activity Most women are able to exercise regularly during pregnancy. Exercise routines may need to change at the end of your pregnancy. Talk to your provider about your activities and exercise routine. Relieving pain and discomfort Rest often with your legs raised if you have leg cramps or low back pain. Take warm sitz baths to soothe pain from hemorrhoids. Use hemorrhoid cream if your provider says it's okay. Wear a good, supportive bra if your breasts hurt. Do not use hot tubs, steam rooms, or saunas. Do not douche. Do not use tampons or scented pads. Safety Talk to your provider before traveling far distances. Wear your seatbelt at all times when you're in a car. Talk to your provider if someone hits you, hurts you, or yells at you. Preparing for birth To prepare for your baby: Take childbirth and breastfeeding classes. Visit the hospital and tour the maternity area. Buy a rear-facing car seat. Learn how to install it in your car. General instructions Avoid cat litter boxes and soil used by cats. These things carry germs that can cause harm to your pregnancy and your baby. Do not drink alcohol, smoke, vape, or use products with nicotine or tobacco in them. If you need help quitting, talk with your provider. Keep all follow-up visits for your third trimester. Your provider will do more exams and tests during this trimester. Write down your questions. Take them to your prenatal visits. Your provider also will: Talk with you about  your overall health. Give you advice or refer you to specialists who can help with different needs, including: Mental health and counseling. Foods and healthy eating. Ask for help if you need help with food. Where to find more information American Pregnancy Association: americanpregnancy.org [employer]: acog.org Office on [employer] Health:  travellesson.ca Contact a health care provider if: You have a headache that does not go away when you take medicine. You have any of these problems: You can't eat or drink. You have nausea and vomiting. You have watery poop (diarrhea) for 2 days or more. You have pain when you pee, or your pee smells bad. You have been sick for 2 days or more and aren't getting better. Contact your provider right away if: You have any of these coming from your vagina: Abnormal discharge. Bad-smelling fluid. Bleeding. Your baby is moving less than usual. You have signs of labor: You have any contractions, belly cramping, or have pain in your pelvis or lower back before 37 weeks of pregnancy (preterm labor). You have regular contractions that are less than 5 minutes apart. Your water breaks. You have symptoms of high blood pressure or preeclampsia. These include: A severe, throbbing headache that does not go away. Sudden or extreme swelling of your face, hands, legs, or feet. Vision problems: You see spots. You have blurry vision. Your eyes are sensitive to light. If you can't reach your provider, go to an urgent care or emergency room. Get help right away if: You faint, become confused, or can't think clearly. You have chest pain or trouble breathing. You have any kind of injury, such as from a fall or a car crash. These symptoms may be an emergency. Call 911 right away. Do not wait to see if the symptoms will go away. Do not drive yourself to the hospital. This information is not intended to replace advice given to you by your health care provider. Make sure you discuss any questions you have with your health care provider. Document Revised: 06/09/2023 Document Reviewed: 01/07/2023 Elsevier Patient Education  2024 Elsevier Inc. Problems to Watch for During Pregnancy During pregnancy, your body goes through many changes. Some changes may be uncomfortable. But most changes are not a serious  problem. It's important to learn when certain signs and symptoms may be a problem. Talk with your health care provider about any medical conditions you have. Make sure you know the symptoms to watch for. Reporting problems early will prevent complications. Problems to watch for during pregnancy You're more likely to get an infection during pregnancy. Let your provider know if you have signs of infection, such as: A fever. A bad-smelling fluid from your vagina. Peeing too often, wanting to pee urgently, or pain when you pee. Also, let your provider know if: You're very tired, you feel dizzy, or you faint. You have watery poop (diarrhea) for 24 hours or longer. You throw up or feel like throwing up for 24 hours or longer. You have cramping in your belly or have pain in your hips or lower back. You have spotting, bleeding, or leaking of fluid from your vagina. You have pain, swelling, or redness in an arm or leg. You should also watch for signs of high blood pressure and preeclampsia. These signs can be very serious. They include: A headache that doesn't go away when you take medicine. Sudden or very bad swelling of your face, hands, legs, or feet. Problems seeing, such as: You see spots. You have  blurry vision. You may be sensitive to light. Why it's important to watch for these problems Watching and reporting problems to your provider can help prevent complications that may affect you and your baby. These include: Higher risk of giving birth early. Infection that may be passed on to your baby. Higher risk for stillbirth. Follow these instructions at home:  Take your medicines only as told. Keep all follow-up visits. Your provider needs to monitor your health and your baby's health. Where to find more information To learn more, go to these websites: Centers for Disease Control and Prevention (CDC) at tonerpromos.no. Then: Click Health Topics A-Z. Type urgent maternal warning signs in the  search box. [employer]necologists (ACOG): acog.org Contact a health care provider if: You have any problems while you're pregnant. You feel your baby moving less than usual. You have any of these things: You have strong emotions, such as sadness or anxiety, that affect your daily life. You do not feel safe in your home. You use tobacco, alcohol, or drugs, and you need help to stop. Get help right away if: You faint, have a seizure, or cannot think clearly. You have chest pain or difficulty breathing. You have any of the following symptoms and you were unable to reach your provider: You have symptoms of infection, including a fever, or have vaginal bleeding. You have symptoms of high blood pressure or preeclampsia. You have signs or symptoms of labor before 37 weeks of pregnancy. These include: Contractions that are 5 minutes or less apart, or that increase in frequency, intensity, or length. Sudden, sharp pain in the belly, or low back pain. Any amount of fluid that flows from your vagina without stopping. These symptoms may be an emergency. Call 911 right away. Do not wait to see if the symptoms will go away. Do not drive yourself to the hospital. This information is not intended to replace advice given to you by your health care provider. Make sure you discuss any questions you have with your health care provider. Document Revised: 02/15/2023 Document Reviewed: 02/15/2023 Elsevier Patient Education  2024 [employer].

## 2024-08-21 NOTE — Assessment & Plan Note (Signed)
" >>  ASSESSMENT AND PLAN FOR MILD PREECLAMPSIA WRITTEN ON 08/21/2024 10:08 AM BY Charrisse Masley L, CNM  RNST. CBC/CMP & UP/C collected MRBP. Asymptomatic. NST next week, then growth u/s with BPP following week. Continue weekly testing with labs. Precautions reinforced. "

## 2024-08-21 NOTE — Assessment & Plan Note (Signed)
 RNST. CBC/CMP & UP/C collected MRBP. Asymptomatic. NST next week, then growth u/s with BPP following week. Continue weekly testing with labs. Precautions reinforced.

## 2024-08-22 LAB — COMPREHENSIVE METABOLIC PANEL WITH GFR
ALT: 6 IU/L (ref 0–32)
AST: 17 IU/L (ref 0–40)
Albumin: 3.7 g/dL — ABNORMAL LOW (ref 3.9–4.9)
Alkaline Phosphatase: 115 IU/L (ref 41–116)
BUN/Creatinine Ratio: 9 (ref 9–23)
BUN: 6 mg/dL (ref 6–20)
Bilirubin Total: 0.3 mg/dL (ref 0.0–1.2)
CO2: 18 mmol/L — ABNORMAL LOW (ref 20–29)
Calcium: 8.8 mg/dL (ref 8.7–10.2)
Chloride: 104 mmol/L (ref 96–106)
Creatinine, Ser: 0.69 mg/dL (ref 0.57–1.00)
Globulin, Total: 2.5 g/dL (ref 1.5–4.5)
Glucose: 67 mg/dL — ABNORMAL LOW (ref 70–99)
Potassium: 3.6 mmol/L (ref 3.5–5.2)
Sodium: 138 mmol/L (ref 134–144)
Total Protein: 6.2 g/dL (ref 6.0–8.5)
eGFR: 117 mL/min/1.73 (ref >59)

## 2024-08-22 LAB — CBC
Hematocrit: 34.5 % (ref 34.0–46.6)
Hemoglobin: 11.6 g/dL (ref 11.1–15.9)
MCH: 29.4 pg (ref 26.6–33.0)
MCHC: 33.6 g/dL (ref 31.5–35.7)
MCV: 88 fL (ref 79–97)
Platelets: 178 x10E3/uL (ref 150–450)
RBC: 3.94 x10E6/uL (ref 3.77–5.28)
RDW: 13.1 % (ref 11.7–15.4)
WBC: 10.5 x10E3/uL (ref 3.4–10.8)

## 2024-08-22 LAB — PROTEIN / CREATININE RATIO, URINE
Creatinine, Urine: 121.6 mg/dL
Protein, Ur: 30.3 mg/dL
Protein/Creat Ratio: 249 mg/g{creat} — ABNORMAL HIGH (ref 0–200)

## 2024-08-24 ENCOUNTER — Ambulatory Visit: Payer: Self-pay | Admitting: Certified Nurse Midwife

## 2024-08-28 ENCOUNTER — Other Ambulatory Visit

## 2024-08-28 ENCOUNTER — Ambulatory Visit

## 2024-08-28 ENCOUNTER — Other Ambulatory Visit: Payer: Self-pay

## 2024-08-28 VITALS — BP 137/84 | HR 80 | Ht 64.0 in | Wt 189.6 lb

## 2024-08-28 DIAGNOSIS — O133 Gestational [pregnancy-induced] hypertension without significant proteinuria, third trimester: Secondary | ICD-10-CM

## 2024-08-28 DIAGNOSIS — O132 Gestational [pregnancy-induced] hypertension without significant proteinuria, second trimester: Secondary | ICD-10-CM

## 2024-08-28 DIAGNOSIS — Z3A33 33 weeks gestation of pregnancy: Secondary | ICD-10-CM

## 2024-08-28 NOTE — Patient Instructions (Signed)
 Nonstress Test: What to Expect A nonstress test, also called an NST, is done during pregnancy to check your baby's heartbeat. The procedure can help to show if your baby is healthy. It may be done if: Your due date has passed. Your pregnancy is high risk. Your baby is moving less than normal. You've lost a previous pregnancy. Your baby is growing slowly. There's too much or too little fluid around your baby. The NST may be done in the third trimester to find out if it's best for your baby to be born early. During an NST, your baby's heartbeat is watched for at least 20 minutes. If the baby is healthy, the heart rate will go up when the baby moves and will return to normal when the baby rests. This should happen at least twice during the test. Tell a health care provider about: Any allergies you have. Any medical problems you have. All medicines you take. These include vitamins, herbs, eye drops, and creams. Any surgeries you've had. Any past pregnancies you've had. What are the risks? There are no risks to you or your baby from a nonstress test. This procedure shouldn't be painful or uncomfortable. What happens before? Eat a meal right before the test or as told by your health care team. Food may help the baby to move. Use the restroom right before the test. What happens during a nonstress test?  Two monitors will be placed on your belly. One will check your baby's heart rate, and the other will check for contractions. You may be asked to lie down on your side or to sit up. You may be given a button to press when you feel your baby move. If your baby seems to be sleeping, you may be asked to drink some juice or soda, eat a snack, or change positions. These steps may vary. Ask what you can expect. What can I expect after? Your team will talk with you about the results and tell you the next steps. If your team gave you any diet or activity instructions, make sure to follow them. Keep all  follow-up visits. This is important to check on your health and the health of your baby. This information is not intended to replace advice given to you by your health care provider. Make sure you discuss any questions you have with your health care provider. Document Revised: 09/01/2023 Document Reviewed: 09/01/2023 Elsevier Patient Education  2025 [employer].

## 2024-08-28 NOTE — Progress Notes (Signed)
    NURSE VISIT NOTE  Subjective:    Patient ID: Sharon Gardner, female    DOB: 1991-02-16, 33 y.o.   MRN: [id number]  HPI  Patient is a 33 y.o. [phone] female who presents for fetal monitoring per order from Harlene Cisco, CNM.   Objective:    BP 137/84   Pulse 80   Ht 5' 4 (1.626 m)   Wt 189 lb 9.6 oz (86 kg)   LMP 01/07/2024 (Exact Date)   BMI 32.54 kg/m  Estimated Date of Delivery: 10/13/24  [redacted]w[redacted]d  Fetus A Non-Stress Test Interpretation for 08/28/24  Indication: Gestational Hypertension  Fetal Heart Rate A Mode: External Baseline Rate (A): 150 bpm Variability: Moderate Accelerations: 15 x 15 Decelerations: None Multiple birth?: No  Uterine Activity Mode: Toco Contraction Frequency (min): 14 Contraction Duration (sec): 70-80 Contraction Quality: Mild Resting Tone Palpated: Relaxed Resting Time: Adequate  Interpretation (Fetal Testing) Nonstress Test Interpretation: Reactive Overall Impression: Reassuring for gestational age   Assessment:   1. Gestational hypertension, third trimester   2. [redacted] weeks gestation of pregnancy      Plan:   Results reviewed by Estil Mangle, MD and discussed with patient.     Camelia Bars, LPN

## 2024-08-29 LAB — PROTEIN / CREATININE RATIO, URINE
Creatinine, Urine: 106.6 mg/dL
Protein, Ur: 23.9 mg/dL
Protein/Creat Ratio: 224 mg/g{creat} — ABNORMAL HIGH (ref 0–200)

## 2024-08-29 LAB — CBC
Hematocrit: 32.9 % — ABNORMAL LOW (ref 34.0–46.6)
Hemoglobin: 10.8 g/dL — ABNORMAL LOW (ref 11.1–15.9)
MCH: 28.6 pg (ref 26.6–33.0)
MCHC: 32.8 g/dL (ref 31.5–35.7)
MCV: 87 fL (ref 79–97)
Platelets: 153 x10E3/uL (ref 150–450)
RBC: 3.78 x10E6/uL (ref 3.77–5.28)
RDW: 12.9 % (ref 11.7–15.4)
WBC: 10.3 x10E3/uL (ref 3.4–10.8)

## 2024-08-29 LAB — COMPREHENSIVE METABOLIC PANEL WITH GFR
ALT: 4 IU/L (ref 0–32)
AST: 14 IU/L (ref 0–40)
Albumin: 3.6 g/dL — ABNORMAL LOW (ref 3.9–4.9)
Alkaline Phosphatase: 133 IU/L — ABNORMAL HIGH (ref 41–116)
BUN/Creatinine Ratio: 9 (ref 9–23)
BUN: 6 mg/dL (ref 6–20)
Bilirubin Total: 0.3 mg/dL (ref 0.0–1.2)
CO2: 19 mmol/L — ABNORMAL LOW (ref 20–29)
Calcium: 9 mg/dL (ref 8.7–10.2)
Chloride: 105 mmol/L (ref 96–106)
Creatinine, Ser: 0.64 mg/dL (ref 0.57–1.00)
Globulin, Total: 2.4 g/dL (ref 1.5–4.5)
Glucose: 69 mg/dL — ABNORMAL LOW (ref 70–99)
Potassium: 3.5 mmol/L (ref 3.5–5.2)
Sodium: 138 mmol/L (ref 134–144)
Total Protein: 6 g/dL (ref 6.0–8.5)
eGFR: 120 mL/min/1.73 (ref >59)

## 2024-08-31 NOTE — Progress Notes (Unsigned)
° ° °  Return Prenatal Note   Subjective   33 y.o. H5E7987 at [redacted]w[redacted]d presents for this follow-up prenatal visit.  Patient is doing well. She reports good fetal movement. She has no new concerns today. Patient reports: Movement: Present Contractions: Not present  Objective   Flow sheet Vitals: Pulse Rate: 81 BP: 128/82 Total weight gain: 8 lb 4.8 oz (3.765 kg)  General Appearance  No acute distress, well appearing, and well nourished Pulmonary   Normal work of breathing Neurologic   Alert and oriented to person, place, and time Psychiatric   Mood and affect within normal limits   Assessment/Plan   Plan  33 y.o. H5E7987 at [redacted]w[redacted]d presents for follow-up OB visit. Reviewed prenatal record including previous visit note.  Gestational hypertension Normotensive today Reactive NST today (see strip in OBIX) Labs drawn Asymptomatic   Supervision of high risk pregnancy, antepartum Reviewed kick counts and preterm labor warning signs. Instructed to call office or come to hospital with persistent headache, vision changes, regular contractions, leaking of fluid, decreased fetal movement or vaginal bleeding.       Orders Placed This Encounter  Procedures   CBC   Comprehensive metabolic panel with GFR   Protein / creatinine ratio, urine   Fetal nonstress test   No follow-ups on file.   Future Appointments  Date Time Provider Department Center  09/04/2024  3:00 PM [facility] US  1 [facility] None  09/11/2024  9:15 AM [facility]NST ROOM [facility] None  09/18/2024  9:15 AM [facility]-NST ROOM [facility] None  09/18/2024  9:35 AM Jayne Harlene CROME, CNM [facility] None  09/19/2024  8:00 AM [facility] PAT3 [facility] None    For next visit:  continue with routine prenatal care     Damien Parsley, CNM [facility] 12/16/202512:04 PM

## 2024-09-04 ENCOUNTER — Other Ambulatory Visit

## 2024-09-04 ENCOUNTER — Ambulatory Visit: Admitting: Certified Nurse Midwife

## 2024-09-04 VITALS — BP 128/82 | HR 81 | Wt 190.3 lb

## 2024-09-04 DIAGNOSIS — O133 Gestational [pregnancy-induced] hypertension without significant proteinuria, third trimester: Secondary | ICD-10-CM | POA: Diagnosis not present

## 2024-09-04 DIAGNOSIS — O0990 Supervision of high risk pregnancy, unspecified, unspecified trimester: Secondary | ICD-10-CM

## 2024-09-04 DIAGNOSIS — Z3A36 36 weeks gestation of pregnancy: Secondary | ICD-10-CM

## 2024-09-04 DIAGNOSIS — Z3A34 34 weeks gestation of pregnancy: Secondary | ICD-10-CM | POA: Diagnosis not present

## 2024-09-04 NOTE — Assessment & Plan Note (Signed)
" >>  ASSESSMENT AND PLAN FOR MILD PREECLAMPSIA WRITTEN ON 09/04/2024 12:04 PM BY Marishka Rentfrow, CNM  Normotensive today Reactive NST today (see strip in OBIX) Labs drawn Asymptomatic  "

## 2024-09-04 NOTE — Assessment & Plan Note (Signed)
 Normotensive today Reactive NST today (see strip in OBIX) Labs drawn Asymptomatic

## 2024-09-04 NOTE — Assessment & Plan Note (Signed)
 Reviewed kick counts and preterm labor warning signs. Instructed to call office or come to hospital with persistent headache, vision changes, regular contractions, leaking of fluid, decreased fetal movement or vaginal bleeding.

## 2024-09-05 LAB — COMPREHENSIVE METABOLIC PANEL WITH GFR
ALT: 4 IU/L (ref 0–32)
AST: 14 IU/L (ref 0–40)
Albumin: 3.6 g/dL — ABNORMAL LOW (ref 3.9–4.9)
Alkaline Phosphatase: 170 IU/L — ABNORMAL HIGH (ref 41–116)
BUN/Creatinine Ratio: 8 — ABNORMAL LOW (ref 9–23)
BUN: 5 mg/dL — ABNORMAL LOW (ref 6–20)
Bilirubin Total: 0.4 mg/dL (ref 0.0–1.2)
CO2: 18 mmol/L — ABNORMAL LOW (ref 20–29)
Calcium: 9.2 mg/dL (ref 8.7–10.2)
Chloride: 106 mmol/L (ref 96–106)
Creatinine, Ser: 0.62 mg/dL (ref 0.57–1.00)
Globulin, Total: 2.5 g/dL (ref 1.5–4.5)
Glucose: 67 mg/dL — ABNORMAL LOW (ref 70–99)
Potassium: 3.6 mmol/L (ref 3.5–5.2)
Sodium: 139 mmol/L (ref 134–144)
Total Protein: 6.1 g/dL (ref 6.0–8.5)
eGFR: 120 mL/min/1.73 (ref >59)

## 2024-09-05 LAB — PROTEIN / CREATININE RATIO, URINE
Creatinine, Urine: 94.5 mg/dL
Protein, Ur: 24.6 mg/dL
Protein/Creat Ratio: 260 mg/g{creat} — ABNORMAL HIGH (ref 0–200)

## 2024-09-05 LAB — CBC
Hematocrit: 34.6 % (ref 34.0–46.6)
Hemoglobin: 11.3 g/dL (ref 11.1–15.9)
MCH: 28.6 pg (ref 26.6–33.0)
MCHC: 32.7 g/dL (ref 31.5–35.7)
MCV: 88 fL (ref 79–97)
Platelets: 165 x10E3/uL (ref 150–450)
RBC: 3.95 x10E6/uL (ref 3.77–5.28)
RDW: 13.1 % (ref 11.7–15.4)
WBC: 9.6 x10E3/uL (ref 3.4–10.8)

## 2024-09-06 ENCOUNTER — Ambulatory Visit: Payer: Self-pay | Admitting: Certified Nurse Midwife

## 2024-09-11 ENCOUNTER — Ambulatory Visit (INDEPENDENT_AMBULATORY_CARE_PROVIDER_SITE_OTHER)

## 2024-09-11 ENCOUNTER — Other Ambulatory Visit: Payer: Self-pay

## 2024-09-11 VITALS — BP 134/82 | HR 84 | Ht 64.0 in | Wt 190.4 lb

## 2024-09-11 DIAGNOSIS — O132 Gestational [pregnancy-induced] hypertension without significant proteinuria, second trimester: Secondary | ICD-10-CM

## 2024-09-11 DIAGNOSIS — O133 Gestational [pregnancy-induced] hypertension without significant proteinuria, third trimester: Secondary | ICD-10-CM | POA: Diagnosis not present

## 2024-09-11 DIAGNOSIS — Z3A35 35 weeks gestation of pregnancy: Secondary | ICD-10-CM

## 2024-09-11 NOTE — Progress Notes (Signed)
" ° ° °  NURSE VISIT NOTE  Subjective:    Patient ID: Sharon Gardner, female    DOB: 1991/04/24, 33 y.o.   MRN: [id number]  HPI  Patient is a 33 y.o. [phone] female who presents for fetal monitoring per order from [facility], CNM.   Objective:    BP 134/82   Pulse 84   Ht 5' 4 (1.626 m)   Wt 190 lb 6.4 oz (86.4 kg)   LMP 01/07/2024 (Exact Date)   BMI 32.68 kg/m  Estimated Date of Delivery: 10/13/24  [redacted]w[redacted]d  Fetus A Non-Stress Test Interpretation for 09/11/2024  Indication: Gestational Hypertension  Fetal Heart Rate A Mode: External Baseline Rate (A): 150 bpm Variability: Moderate Accelerations: 15 x 15 Decelerations: None Multiple birth?: No  Uterine Activity Mode: Toco Contraction Frequency (min): Rare x 1 Contraction Duration (sec): 60 Contraction Quality: Mild Resting Time: Adequate  Interpretation (Fetal Testing) Nonstress Test Interpretation: Reactive Overall Impression: Reassuring for gestational age   Assessment:   1. Gestational hypertension, third trimester   2. [redacted] weeks gestation of pregnancy      Plan:   Results reviewed by Harland Birkenhead, MD and discussed with patient.     Camelia Bars, LPN  "

## 2024-09-11 NOTE — Patient Instructions (Signed)
 Nonstress Test: What to Expect A nonstress test, also called an NST, is done during pregnancy to check your baby's heartbeat. The procedure can help to show if your baby is healthy. It may be done if: Your due date has passed. Your pregnancy is high risk. Your baby is moving less than normal. You've lost a previous pregnancy. Your baby is growing slowly. There's too much or too little fluid around your baby. The NST may be done in the third trimester to find out if it's best for your baby to be born early. During an NST, your baby's heartbeat is watched for at least 20 minutes. If the baby is healthy, the heart rate will go up when the baby moves and will return to normal when the baby rests. This should happen at least twice during the test. Tell a health care provider about: Any allergies you have. Any medical problems you have. All medicines you take. These include vitamins, herbs, eye drops, and creams. Any surgeries you've had. Any past pregnancies you've had. What are the risks? There are no risks to you or your baby from a nonstress test. This procedure shouldn't be painful or uncomfortable. What happens before? Eat a meal right before the test or as told by your health care team. Food may help the baby to move. Use the restroom right before the test. What happens during a nonstress test?  Two monitors will be placed on your belly. One will check your baby's heart rate, and the other will check for contractions. You may be asked to lie down on your side or to sit up. You may be given a button to press when you feel your baby move. If your baby seems to be sleeping, you may be asked to drink some juice or soda, eat a snack, or change positions. These steps may vary. Ask what you can expect. What can I expect after? Your team will talk with you about the results and tell you the next steps. If your team gave you any diet or activity instructions, make sure to follow them. Keep all  follow-up visits. This is important to check on your health and the health of your baby. This information is not intended to replace advice given to you by your health care provider. Make sure you discuss any questions you have with your health care provider. Document Revised: 09/01/2023 Document Reviewed: 09/01/2023 Elsevier Patient Education  2025 [employer].

## 2024-09-12 LAB — COMPREHENSIVE METABOLIC PANEL WITH GFR
ALT: 5 IU/L (ref 0–32)
AST: 14 IU/L (ref 0–40)
Albumin: 3.6 g/dL — ABNORMAL LOW (ref 3.9–4.9)
Alkaline Phosphatase: 213 IU/L — ABNORMAL HIGH (ref 41–116)
BUN/Creatinine Ratio: 8 — ABNORMAL LOW (ref 9–23)
BUN: 5 mg/dL — ABNORMAL LOW (ref 6–20)
Bilirubin Total: 0.4 mg/dL (ref 0.0–1.2)
CO2: 20 mmol/L (ref 20–29)
Calcium: 8.5 mg/dL — ABNORMAL LOW (ref 8.7–10.2)
Chloride: 106 mmol/L (ref 96–106)
Creatinine, Ser: 0.62 mg/dL (ref 0.57–1.00)
Globulin, Total: 2.3 g/dL (ref 1.5–4.5)
Glucose: 70 mg/dL (ref 70–99)
Potassium: 3.4 mmol/L — ABNORMAL LOW (ref 3.5–5.2)
Sodium: 138 mmol/L (ref 134–144)
Total Protein: 5.9 g/dL — ABNORMAL LOW (ref 6.0–8.5)
eGFR: 120 mL/min/1.73

## 2024-09-12 LAB — PROTEIN / CREATININE RATIO, URINE
Creatinine, Urine: 110.8 mg/dL
Protein, Ur: 34.2 mg/dL
Protein/Creat Ratio: 309 mg/g{creat} — ABNORMAL HIGH (ref 0–200)

## 2024-09-14 ENCOUNTER — Encounter: Payer: Self-pay | Admitting: Obstetrics

## 2024-09-14 ENCOUNTER — Encounter: Payer: Self-pay | Admitting: Registered Nurse

## 2024-09-14 NOTE — Telephone Encounter (Signed)
 Patient has been r/s to 09/17/24.

## 2024-09-17 ENCOUNTER — Other Ambulatory Visit

## 2024-09-17 ENCOUNTER — Inpatient Hospital Stay
Admission: EM | Admit: 2024-09-17 | Discharge: 2024-09-19 | DRG: 787 | Disposition: A | Discharge status: Home / Self Care | Attending: Obstetrics | Admitting: Obstetrics

## 2024-09-17 ENCOUNTER — Encounter
Admission: EM | Disposition: A | Discharge status: Home / Self Care | Payer: Self-pay | Source: Home / Self Care | Attending: Certified Nurse Midwife

## 2024-09-17 ENCOUNTER — Other Ambulatory Visit: Payer: Self-pay

## 2024-09-17 ENCOUNTER — Inpatient Hospital Stay: Admitting: Certified Registered"

## 2024-09-17 ENCOUNTER — Encounter: Payer: Self-pay | Admitting: Obstetrics & Gynecology

## 2024-09-17 ENCOUNTER — Ambulatory Visit (INDEPENDENT_AMBULATORY_CARE_PROVIDER_SITE_OTHER): Admitting: Obstetrics & Gynecology

## 2024-09-17 VITALS — BP 156/94 | HR 97 | Wt 193.6 lb

## 2024-09-17 DIAGNOSIS — O1413 Severe pre-eclampsia, third trimester: Secondary | ICD-10-CM | POA: Diagnosis present

## 2024-09-17 DIAGNOSIS — Z8759 Personal history of other complications of pregnancy, childbirth and the puerperium: Secondary | ICD-10-CM

## 2024-09-17 DIAGNOSIS — O133 Gestational [pregnancy-induced] hypertension without significant proteinuria, third trimester: Secondary | ICD-10-CM

## 2024-09-17 DIAGNOSIS — O99210 Obesity complicating pregnancy, unspecified trimester: Secondary | ICD-10-CM

## 2024-09-17 DIAGNOSIS — O1414 Severe pre-eclampsia complicating childbirth: Principal | ICD-10-CM

## 2024-09-17 DIAGNOSIS — O34219 Maternal care for unspecified type scar from previous cesarean delivery: Secondary | ICD-10-CM | POA: Diagnosis not present

## 2024-09-17 DIAGNOSIS — D62 Acute posthemorrhagic anemia: Secondary | ICD-10-CM | POA: Diagnosis not present

## 2024-09-17 DIAGNOSIS — O9081 Anemia of the puerperium: Secondary | ICD-10-CM | POA: Diagnosis not present

## 2024-09-17 DIAGNOSIS — O1403 Mild to moderate pre-eclampsia, third trimester: Principal | ICD-10-CM

## 2024-09-17 DIAGNOSIS — O34211 Maternal care for low transverse scar from previous cesarean delivery: Secondary | ICD-10-CM | POA: Diagnosis present

## 2024-09-17 DIAGNOSIS — O1490 Unspecified pre-eclampsia, unspecified trimester: Secondary | ICD-10-CM | POA: Diagnosis present

## 2024-09-17 DIAGNOSIS — O163 Unspecified maternal hypertension, third trimester: Secondary | ICD-10-CM | POA: Diagnosis present

## 2024-09-17 DIAGNOSIS — Z3A36 36 weeks gestation of pregnancy: Secondary | ICD-10-CM | POA: Diagnosis not present

## 2024-09-17 DIAGNOSIS — O0990 Supervision of high risk pregnancy, unspecified, unspecified trimester: Secondary | ICD-10-CM | POA: Diagnosis not present

## 2024-09-17 DIAGNOSIS — R030 Elevated blood-pressure reading, without diagnosis of hypertension: Secondary | ICD-10-CM | POA: Diagnosis present

## 2024-09-17 DIAGNOSIS — Z98891 History of uterine scar from previous surgery: Secondary | ICD-10-CM

## 2024-09-17 HISTORY — DX: Anxiety disorder, unspecified: F41.9

## 2024-09-17 LAB — CBC WITH DIFFERENTIAL/PLATELET
Abs Immature Granulocytes: 0.09 K/uL — ABNORMAL HIGH (ref 0.00–0.07)
Basophils Absolute: 0.1 K/uL (ref 0.0–0.1)
Basophils Relative: 1 %
Eosinophils Absolute: 0.1 K/uL (ref 0.0–0.5)
Eosinophils Relative: 1 %
HCT: 31.1 % — ABNORMAL LOW (ref 36.0–46.0)
Hemoglobin: 11 g/dL — ABNORMAL LOW (ref 12.0–15.0)
Immature Granulocytes: 1 %
Lymphocytes Relative: 15 %
Lymphs Abs: 1.5 K/uL (ref 0.7–4.0)
MCH: 28.7 pg (ref 26.0–34.0)
MCHC: 35.4 g/dL (ref 30.0–36.0)
MCV: 81.2 fL (ref 80.0–100.0)
Monocytes Absolute: 0.9 K/uL (ref 0.1–1.0)
Monocytes Relative: 9 %
Neutro Abs: 7.5 K/uL (ref 1.7–7.7)
Neutrophils Relative %: 73 %
Platelets: 182 K/uL (ref 150–400)
RBC: 3.83 MIL/uL — ABNORMAL LOW (ref 3.87–5.11)
RDW: 12.7 % (ref 11.5–15.5)
WBC: 10.2 K/uL (ref 4.0–10.5)
nRBC: 0 % (ref 0.0–0.2)

## 2024-09-17 LAB — CHLAMYDIA/NGC RT PCR (ARMC ONLY)
Chlamydia Tr: NOT DETECTED
N gonorrhoeae: NOT DETECTED

## 2024-09-17 LAB — TYPE AND SCREEN
ABO/RH(D): AB POS
Antibody Screen: NEGATIVE

## 2024-09-17 LAB — PROTEIN / CREATININE RATIO, URINE
Creatinine, Urine: 60 mg/dL
Protein Creatinine Ratio: 0.4 mg/mg — ABNORMAL HIGH
Total Protein, Urine: 24 mg/dL

## 2024-09-17 LAB — GROUP B STREP BY PCR: Group B strep by PCR: NEGATIVE

## 2024-09-17 LAB — CBC
HCT: 31.8 % — ABNORMAL LOW (ref 36.0–46.0)
Hemoglobin: 11 g/dL — ABNORMAL LOW (ref 12.0–15.0)
MCH: 28.4 pg (ref 26.0–34.0)
MCHC: 34.6 g/dL (ref 30.0–36.0)
MCV: 82.2 fL (ref 80.0–100.0)
Platelets: 178 K/uL (ref 150–400)
RBC: 3.87 MIL/uL (ref 3.87–5.11)
RDW: 12.9 % (ref 11.5–15.5)
WBC: 11.3 K/uL — ABNORMAL HIGH (ref 4.0–10.5)
nRBC: 0 % (ref 0.0–0.2)

## 2024-09-17 LAB — COMPREHENSIVE METABOLIC PANEL WITH GFR
ALT: <5 U/L (ref 0–44)
AST: 19 U/L (ref 15–41)
Albumin: 3.4 g/dL — ABNORMAL LOW (ref 3.5–5.0)
Alkaline Phosphatase: 244 U/L — ABNORMAL HIGH (ref 38–126)
Anion gap: 14 (ref 5–15)
BUN: 6 mg/dL (ref 6–20)
CO2: 19 mmol/L — ABNORMAL LOW (ref 22–32)
Calcium: 9 mg/dL (ref 8.9–10.3)
Chloride: 105 mmol/L (ref 98–111)
Creatinine, Ser: 0.56 mg/dL (ref 0.44–1.00)
GFR, Estimated: >60 mL/min
Glucose, Bld: 74 mg/dL (ref 70–99)
Potassium: 3.2 mmol/L — ABNORMAL LOW (ref 3.5–5.1)
Sodium: 138 mmol/L (ref 135–145)
Total Bilirubin: 0.4 mg/dL (ref 0.0–1.2)
Total Protein: 6.4 g/dL — ABNORMAL LOW (ref 6.5–8.1)

## 2024-09-17 SURGERY — Surgical Case
Anesthesia: Spinal

## 2024-09-17 MED ORDER — FENTANYL CITRATE (PF) 100 MCG/2ML IJ SOLN
INTRAMUSCULAR | Status: DC | PRN
  Administered 2024-09-17: 10 ug via INTRATHECAL

## 2024-09-17 MED ORDER — LACTATED RINGERS IV SOLN: INTRAVENOUS | Status: DC | PRN

## 2024-09-17 MED ORDER — MORPHINE SULFATE (PF) 0.5 MG/ML IJ SOLN
INTRAMUSCULAR | Status: DC | PRN
  Administered 2024-09-17: 200 ug via INTRATHECAL

## 2024-09-17 MED ORDER — LABETALOL HCL 5 MG/ML IV SOLN: 80.0000 mg | INTRAVENOUS | Status: DC | PRN

## 2024-09-17 MED ORDER — BUPIVACAINE HCL (PF) 0.5 % IJ SOLN
INTRAMUSCULAR | Status: AC
  Filled 2024-09-17: qty 30

## 2024-09-17 MED ORDER — PHENYLEPHRINE HCL-NACL 20-0.9 MG/250ML-% IV SOLN
INTRAVENOUS | Status: DC | PRN
  Administered 2024-09-17: 40 ug/min via INTRAVENOUS

## 2024-09-17 MED ORDER — MORPHINE SULFATE (PF) 0.5 MG/ML IJ SOLN
INTRAMUSCULAR | Status: AC
  Filled 2024-09-17: qty 10

## 2024-09-17 MED ORDER — OXYTOCIN-SODIUM CHLORIDE 30-0.9 UT/500ML-% IV SOLN
INTRAVENOUS | Status: AC
  Filled 2024-09-17: qty 500

## 2024-09-17 MED ORDER — BUPIVACAINE IN DEXTROSE 0.75-8.25 % IT SOLN
INTRATHECAL | Status: DC | PRN
  Administered 2024-09-17: 1.4 mL via INTRATHECAL

## 2024-09-17 MED ORDER — MAGNESIUM SULFATE BOLUS VIA INFUSION
4.0000 g | Freq: Once | INTRAVENOUS | Status: AC
  Administered 2024-09-17: 4 g via INTRAVENOUS
  Filled 2024-09-17: qty 1000

## 2024-09-17 MED ORDER — ONDANSETRON HCL 4 MG/2ML IJ SOLN
INTRAMUSCULAR | Status: DC | PRN
  Administered 2024-09-17: 4 mg via INTRAVENOUS

## 2024-09-17 MED ORDER — FENTANYL CITRATE (PF) 100 MCG/2ML IJ SOLN
INTRAMUSCULAR | Status: AC
  Filled 2024-09-17: qty 2

## 2024-09-17 MED ORDER — LABETALOL HCL 5 MG/ML IV SOLN
20.0000 mg | INTRAVENOUS | Status: DC | PRN
  Administered 2024-09-17: 20 mg via INTRAVENOUS

## 2024-09-17 MED ORDER — SOD CITRATE-CITRIC ACID 500-334 MG/5ML PO SOLN: 30.0000 mL | ORAL | Status: AC

## 2024-09-17 MED ORDER — HYDRALAZINE HCL 20 MG/ML IJ SOLN: 10.0000 mg | INTRAMUSCULAR | Status: DC | PRN

## 2024-09-17 MED ORDER — PHENYLEPHRINE HCL (PRESSORS) 10 MG/ML IV SOLN
INTRAVENOUS | Status: AC
  Filled 2024-09-17: qty 1

## 2024-09-17 MED ORDER — SOD CITRATE-CITRIC ACID 500-334 MG/5ML PO SOLN
ORAL | Status: AC
  Administered 2024-09-17: 30 mL via ORAL
  Filled 2024-09-17: qty 15

## 2024-09-17 MED ORDER — CEFAZOLIN SODIUM-DEXTROSE 2-4 GM/100ML-% IV SOLN
2.0000 g | INTRAVENOUS | Status: AC
  Administered 2024-09-17: 2 g via INTRAVENOUS

## 2024-09-17 MED ORDER — POVIDONE-IODINE 10 % EX SWAB
2.0000 | Freq: Once | CUTANEOUS | Status: AC
  Administered 2024-09-17: 2 via TOPICAL

## 2024-09-17 MED ORDER — ACETAMINOPHEN 500 MG PO TABS
1000.0000 mg | ORAL_TABLET | Freq: Four times a day (QID) | ORAL | Status: DC | PRN
  Administered 2024-09-17: 1000 mg via ORAL
  Filled 2024-09-17 (×2): qty 2

## 2024-09-17 MED ORDER — LIDOCAINE HCL (PF) 1 % IJ SOLN
INTRAMUSCULAR | Status: DC | PRN
  Administered 2024-09-17: 3 mL via SUBCUTANEOUS

## 2024-09-17 MED ORDER — ACETAMINOPHEN 500 MG PO TABS: 1000.0000 mg | ORAL_TABLET | ORAL | Status: DC

## 2024-09-17 MED ORDER — KETOROLAC TROMETHAMINE 30 MG/ML IJ SOLN
INTRAMUSCULAR | Status: DC | PRN
  Administered 2024-09-17: 30 mg via INTRAVENOUS

## 2024-09-17 MED ORDER — METOCLOPRAMIDE HCL 5 MG/ML IJ SOLN
INTRAMUSCULAR | Status: DC | PRN
  Administered 2024-09-17: 5 mg via INTRAVENOUS

## 2024-09-17 MED ORDER — LABETALOL HCL 5 MG/ML IV SOLN: 40.0000 mg | INTRAVENOUS | Status: DC | PRN

## 2024-09-17 MED ORDER — CEFAZOLIN SODIUM-DEXTROSE 2-4 GM/100ML-% IV SOLN
INTRAVENOUS | Status: AC
  Filled 2024-09-17: qty 100

## 2024-09-17 MED ORDER — SCOPOLAMINE 1 MG/3DAYS TD PT72
1.0000 | MEDICATED_PATCH | TRANSDERMAL | Status: DC
  Administered 2024-09-18: 1 mg via TRANSDERMAL

## 2024-09-17 MED ORDER — SCOPOLAMINE 1 MG/3DAYS TD PT72
MEDICATED_PATCH | TRANSDERMAL | Status: AC
  Filled 2024-09-17: qty 1

## 2024-09-17 MED ORDER — LACTATED RINGERS IV SOLN: INTRAVENOUS | Status: AC

## 2024-09-17 MED ORDER — LACTATED RINGERS IV SOLN: INTRAVENOUS | Status: DC

## 2024-09-17 MED ORDER — LABETALOL HCL 5 MG/ML IV SOLN
INTRAVENOUS | Status: AC
  Filled 2024-09-17: qty 4

## 2024-09-17 MED ORDER — OXYTOCIN-SODIUM CHLORIDE 30-0.9 UT/500ML-% IV SOLN
INTRAVENOUS | Status: DC | PRN
  Administered 2024-09-17: 250 mL/h via INTRAVENOUS

## 2024-09-17 MED ORDER — MAGNESIUM SULFATE 40 GM/1000ML IV SOLN
2.0000 g/h | INTRAVENOUS | Status: DC
  Administered 2024-09-17 – 2024-09-18 (×2): 2 g/h via INTRAVENOUS
  Filled 2024-09-17 (×2): qty 1000

## 2024-09-17 MED ORDER — DEXAMETHASONE SOD PHOSPHATE PF 10 MG/ML IJ SOLN
INTRAMUSCULAR | Status: DC | PRN
  Administered 2024-09-17: 10 mg via INTRAVENOUS

## 2024-09-17 SURGICAL SUPPLY — 33 items
BAG COUNTER SPONGE SURGICOUNT (BAG) ×1 IMPLANT
BENZOIN TINCTURE PRP APPL 2/3 (GAUZE/BANDAGES/DRESSINGS) IMPLANT
CATH KIT ON-Q SILVERSOAK 5 (CATHETERS) IMPLANT
CHLORAPREP W/TINT 26 (MISCELLANEOUS) ×2 IMPLANT
DERMABOND ADVANCED .7 DNX12 (GAUZE/BANDAGES/DRESSINGS) IMPLANT
DRESSING PEEL AND PLAC PRVNA20 (GAUZE/BANDAGES/DRESSINGS) IMPLANT
DRSG TEGADERM 4X4.75 (GAUZE/BANDAGES/DRESSINGS) IMPLANT
DRSG TELFA 3X8 NADH STRL (GAUZE/BANDAGES/DRESSINGS) ×1 IMPLANT
ELECTRODE REM PT RTRN 9FT ADLT (ELECTROSURGICAL) ×1 IMPLANT
GAUZE SPONGE 4X4 12PLY STRL (GAUZE/BANDAGES/DRESSINGS) ×1 IMPLANT
GLOVE BIO SURGEON STRL SZ 6 (GLOVE) ×1 IMPLANT
GLOVE BIOGEL PI IND STRL 6 (GLOVE) ×1 IMPLANT
GOWN STRL REUS W/ TWL LRG LVL3 (GOWN DISPOSABLE) ×2 IMPLANT
KIT TURNOVER KIT A (KITS) ×1 IMPLANT
MANIFOLD NEPTUNE II (INSTRUMENTS) ×1 IMPLANT
MAT PREVALON FULL STRYKER (MISCELLANEOUS) ×1 IMPLANT
PACK C SECTION AR (MISCELLANEOUS) ×1 IMPLANT
PAD OB MATERNITY 11 LF (PERSONAL CARE ITEMS) ×1 IMPLANT
PAD PREP OB/GYN DISP 24X41 (PERSONAL CARE ITEMS) ×1 IMPLANT
RETRACTOR TRAXI PANNICULUS (MISCELLANEOUS) IMPLANT
RETRACTOR WND ALEXIS-O 25 LRG (MISCELLANEOUS) IMPLANT
RTRCTR C-SECT PINK 25CM LRG (MISCELLANEOUS) IMPLANT
SCRUB CHG 4% DYNA-HEX 4OZ (MISCELLANEOUS) ×1 IMPLANT
SOLN 0.9% NACL POUR BTL 1000ML (IV SOLUTION) ×1 IMPLANT
SOLN STERILE WATER 500 ML (IV SOLUTION) ×1 IMPLANT
SUT MNCRL AB 4-0 PS2 18 (SUTURE) ×1 IMPLANT
SUT MON AB 3-0 SH 27 (SUTURE) IMPLANT
SUT PDS AB 1 TP1 96 (SUTURE) IMPLANT
SUT VIC AB 0 CT1 36 (SUTURE) ×2 IMPLANT
SUT VIC AB 0 CTX36XBRD ANBCTRL (SUTURE) IMPLANT
SUT VIC AB 3-0 SH 27X BRD (SUTURE) ×1 IMPLANT
SUT VIC AB 4-0 KS 27 (SUTURE) IMPLANT
TRAP FLUID SMOKE EVACUATOR (MISCELLANEOUS) ×1 IMPLANT

## 2024-09-17 NOTE — OB Triage Note (Incomplete)
 Patient was sent from the clinic with elevated high blood pressures. She denies having any headache, pain epigastric pain .She also denies bleeding, loss of fluid, contractions but positive fetal movement.SABRA EFM and TOCO in place and assessing . Midwife [place] informed .

## 2024-09-17 NOTE — H&P (Signed)
 Sharon Gardner is a 33 y.o. G3P2 at 36.[redacted] weeks EGA. She was sent from the office with elevated BPs and visual changes yesterday. She has known mild pre eclampsia and has a h/o 2 previous cesarean sections. She is scheduled for a RLTCS at 37 weeks. She reports good FM, denies ROM, bleeding.  Since being at the hospital, she has developed a headache. She was given tylenol  and her headache initially got slightly better but returned even worse, was a 3 and returned at a level 6.  She also started with painful contractions while in triage.  OB History     Gravida  4   Para  2   Term  2   Preterm      AB  1   Living  2      SAB  1   IAB      Ectopic      Multiple  0   Live Births  2          Past Medical History:  Diagnosis Date   Allergy    Anxiety    Dysfunctional uterine bleeding 04/25/2019   Gestational (pregnancy-induced) hypertension without significant proteinuria, third trimester 04/21/2020   Medical history non-contributory    Pregnancy induced hypertension    Past Surgical History:  Procedure Laterality Date   CESAREAN SECTION N/A 02/11/2017   Procedure: CESAREAN SECTION;  Surgeon: Estelle Service, MD;  Location: [facility] BIRTHING SUITES;  Service: Obstetrics;  Laterality: N/A;   CESAREAN SECTION N/A 04/21/2020   Procedure: CESAREAN SECTION;  Surgeon: Estelle Service, MD;  Location: [facility] LD ORS;  Service: Obstetrics;  Laterality: N/A;  RNFA   TONSILLECTOMY     WISDOM TOOTH EXTRACTION     Family History: family history includes Cancer in her paternal grandmother; Cancer (age of onset: 86) in her mother; Miscarriages / Stillbirths in her paternal grandmother. Social History:  reports that she has never smoked. She has never used smokeless tobacco. She reports that she does not drink alcohol and does not use drugs.     Maternal Diabetes: No Genetic Screening: Normal Maternal Ultrasounds/Referrals: Normal Maternal Substance Abuse:  No Significant Maternal  Medications:  None Significant Maternal Lab Results:  None Number of Prenatal Visits:greater than 3 verified prenatal visits   Review of Systems History Dilation: Closed Effacement (%): Thick Station: -3 Exam by:: Starla, MD Blood pressure (!) 149/91, pulse 91, temperature 98.1 F (36.7 C), temperature source Oral, resp. rate 18, last menstrual period 01/07/2024. Exam General:  alert   Breasts:  inspection negative, no nipple discharge or bleeding, no masses or nodularity palpable  Lungs: clear to auscultation bilaterally  Heart:  regular rate and rhythm, S1, S2 normal, no murmur, click, rub or gallop  Abdomen: soft, non-tender; bowel sounds normal; no masses,  no organomegaly   Gravid, benign                   Physical Exam  Prenatal labs: ABO, Rh: AB/Positive/-- (07/15 1511) Antibody: Negative (07/15 1511) Rubella: 6.39 (07/15 1511) RPR: Non Reactive (11/04 0938)  HBsAg: Negative (07/15 1511)  HIV: Non Reactive (11/04 0938)  GBS: PRESUMPTIVE NEGATIVE/-- (12/29 1808)   Assessment/Plan: Severe pre eclampsia diagnosed by severe headache unrelieved with tylenol . Her Pr/Cr ratio has also increased from 0.3 to 0.4. I will start magnesium  sulfate for seizure prophylaxis and plan for repeat cesarean section. She declines a tubal ligation.   Sharon Gardner Starla 09/17/2024, 8:39 PM

## 2024-09-17 NOTE — Anesthesia Preprocedure Evaluation (Signed)
"                                    Anesthesia Evaluation  Patient identified by MRN, date of birth, ID band Patient awake    Reviewed: Allergy & Precautions, H&P , NPO status , Patient's Chart, lab work & pertinent test results  Airway Mallampati: II  TM Distance: >3 FB Neck ROM: full    Dental no notable dental hx.    Pulmonary neg pulmonary ROS   Pulmonary exam normal        Cardiovascular Exercise Tolerance: Good hypertension, Normal cardiovascular exam  Gestational hypertension   Neuro/Psych    GI/Hepatic negative GI ROS,,,  Endo/Other    Renal/GU   negative genitourinary   Musculoskeletal   Abdominal   Peds  Hematology negative hematology ROS (+)   Anesthesia Other Findings Severe pre eclampsia diagnosed by severe headache unrelieved with tylenol . Her Pr/Cr ratio has also increased from 0.3 to 0.4.  Past Medical History: No date: Allergy No date: Anxiety 04/25/2019: Dysfunctional uterine bleeding 04/21/2020: Gestational (pregnancy-induced) hypertension without  significant proteinuria, third trimester No date: Medical history non-contributory No date: Pregnancy induced hypertension  Past Surgical History: 02/11/2017: CESAREAN SECTION; N/A     Comment:  Procedure: CESAREAN SECTION;  Surgeon: Estelle Service, MD;  Location: [facility] BIRTHING SUITES;  Service:               Obstetrics;  Laterality: N/A; 04/21/2020: CESAREAN SECTION; N/A     Comment:  Procedure: CESAREAN SECTION;  Surgeon: Estelle Service, MD;  Location: [facility] LD ORS;  Service: Obstetrics;                Laterality: N/A;  RNFA No date: TONSILLECTOMY No date: WISDOM TOOTH EXTRACTION     Reproductive/Obstetrics (+) Pregnancy                              Anesthesia Physical Anesthesia Plan  ASA: 3  Anesthesia Plan: Spinal   Post-op Pain Management:    Induction:   PONV Risk Score and Plan: Ondansetron   Airway  Management Planned:   Additional Equipment:   Intra-op Plan:   Post-operative Plan:   Informed Consent: I have reviewed the patients History and Physical, chart, labs and discussed the procedure including the risks, benefits and alternatives for the proposed anesthesia with the patient or authorized representative who has indicated his/her understanding and acceptance.     Dental Advisory Given  Plan Discussed with: Anesthesiologist and CRNA  Anesthesia Plan Comments:          Anesthesia Quick Evaluation  "

## 2024-09-17 NOTE — Anesthesia Procedure Notes (Signed)
 Spinal  Patient location during procedure: OB Start time: 09/17/2024 10:00 PM End time: 09/17/2024 10:10 PM Reason for block: surgical anesthesia  Staffing Performed: resident/CRNA  Authorized by: Vicci Camellia Glatter, MD   Performed by: Landy Hone D, CRNA  Preanesthetic Checklist Completed: patient identified, IV checked, site marked, risks and benefits discussed, surgical consent, monitors and equipment checked, pre-op evaluation and timeout performed Spinal Block Patient position: sitting Prep: ChloraPrep Patient monitoring: heart rate, continuous pulse ox and blood pressure Approach: midline Location: L3-4 Injection technique: single-shot Needle Needle type: Pencan  Needle gauge: 25 G Needle length: 9 cm Assessment Sensory level: T4 Events: CSF return  Additional Notes IV functioning, monitors applied to pt. Expiration date of kit checked and confirmed to be in date. Sterile prep and drape, hand hygiene and sterile gloved used. Pt was positioned and spine was prepped in sterile fashion. Skin was anesthetized with lidocaine . Free flow of clear CSF obtained prior to injecting local anesthetic into CSF x 1 attempt. Spinal needle aspirated freely following injection. Needle was carefully withdrawn, and pt tolerated procedure well. Loss of motor and sensory on exam post injection.

## 2024-09-17 NOTE — Progress Notes (Signed)
 "  PRENATAL VISIT NOTE  Subjective:  Sharon Gardner is a 33 y.o. [phone] at [redacted]w[redacted]d being seen today for ongoing prenatal care.  She is currently monitored for the following issues for this high-risk pregnancy and has History of gestational hypertension; S/P primary low transverse C-section; Headache; Mild preeclampsia; S/P repeat low transverse C-section; Depressive disorder; Polycystic ovary syndrome; GAD (generalized anxiety disorder); Anxiety; Other fatigue; Supervision of high risk pregnancy, antepartum; Previous cesarean delivery affecting pregnancy, antepartum; and Obesity in pregnancy, antepartum on their problem list.  Patient reports visual changes yesterday but none today. She is also having some skin itching, not severe.  Contractions: Irritability. Vag. Bleeding: None.  Movement: Present. Denies leaking of fluid.   The following portions of the patient's history were reviewed and updated as appropriate: allergies, current medications, past family history, past medical history, past social history, past surgical history and problem list.   Objective:   Vitals:   09/17/24 1347 09/17/24 1356  BP: (!) 167/98 (!) 149/94  Pulse: 91 97  Weight: 193 lb 9.6 oz (87.8 kg)     Fetal Status:  Fetal Heart Rate (bpm): 150   Movement: Present Presentation: Vertex  NST reactive, category 1, no decelerations  General: Alert, oriented and cooperative. Patient is in no acute distress.  Skin: Skin is warm and dry. No rash noted.   Cardiovascular: Normal heart rate noted  Respiratory: Normal respiratory effort, no problems with respiration noted  Abdomen: Soft, gravid, appropriate for gestational age.  Pain/Pressure: Absent     Pelvic: Cervical exam deferred        Extremities: Normal range of motion.  Edema: Mild pitting, slight indentation  Mental Status: Normal mood and affect. Normal behavior. Normal judgment and thought content.      02/27/2024    8:21 AM 08/05/2021    9:02 AM 03/05/2021     8:39 AM  Depression screen PHQ 2/9  Decreased Interest 0 0 0  Down, Depressed, Hopeless 0 0 0  PHQ - 2 Score 0 0 0  Altered sleeping  0 0  Tired, decreased energy  1 0  Change in appetite  0 0  Feeling bad or failure about yourself   0 0  Trouble concentrating  1 0  Moving slowly or fidgety/restless  0 0  Suicidal thoughts  0 0  PHQ-9 Score  2  0   Difficult doing work/chores  Somewhat difficult Not difficult at all     Data saved with a previous flowsheet row definition        08/05/2021    9:03 AM  GAD 7 : Generalized Anxiety Score  Nervous, Anxious, on Edge 1  Control/stop worrying 0  Worry too much - different things 1  Trouble relaxing 1  Restless 0  Easily annoyed or irritable 1  Afraid - awful might happen 0  Total GAD 7 Score 4  Anxiety Difficulty Somewhat difficult    Assessment and Plan:  Pregnancy: [id number] at [redacted]w[redacted]d 1. History of gestational hypertension (Primary)  2. Mild pre-eclampsia in third trimester - currently denies headache, RUQ/epigastric pain - With her elevated BP today, she will go to the hospital for labs/work up - she will get her cervical cultures  3. Supervision of high risk pregnancy, antepartum  4. Obesity in pregnancy, antepartum  5. Previous cesarean delivery affecting pregnancy, antepartum - plans repeat  6. [redacted] weeks gestation of pregnancy - cervical cultures  Preterm labor symptoms and general obstetric precautions including but not limited to  vaginal bleeding, contractions, leaking of fluid and fetal movement were reviewed in detail with the patient. Please refer to After Visit Summary for other counseling recommendations.   No follow-ups on file.  Future Appointments  Date Time Provider Department Center  09/19/2024  8:00 AM [facility] None  10/02/2024  1:55 PM Leigh Sober, MD [facility] None    [name] Birkenhead, MD  "

## 2024-09-17 NOTE — Op Note (Signed)
 09/17/2024  11:12 PM  PATIENT:  Sharon Gardner  33 y.o. female  PRE-OPERATIVE DIAGNOSIS:  Prior cesarean x 2, severe pre eclampsia  POST-OPERATIVE DIAGNOSIS:  same + dense adhesions  PROCEDURE:  Procedures: CESAREAN DELIVERY (N/A)  SURGEON:  Surgeons and Role:    Harland Birkenhead, MD   ASSISTANTS: Jinnie Cookey, CNM   ANESTHESIA:   local and spinal  EBL:  575 mL   BLOOD ADMINISTERED:none  DRAINS: Urinary Catheter (Foley)   LOCAL MEDICATIONS USED:  MARCAINE      SPECIMEN:  No Specimen  DISPOSITION OF SPECIMEN:  N/A  COUNTS:  YES  TOURNIQUET:  * No tourniquets in log *  DICTATION: .Dragon Dictation  PLAN OF CARE: Admit to inpatient   PATIENT DISPOSITION:  PACU - hemodynamically stable.   Delay start of Pharmacological VTE agent (>24hrs) due to surgical blood loss or risk of bleeding: not applicable  The risks, benefits, and alternatives of surgery were explained, understood, accepted. Consents were signed. All questions were answered. In the operating room spinal anesthesia was applied without complication. Her abdomen and vagina were prepped and draped in the usual sterile fashion. A Foley catheter was placed, draining clear urine throughout case. Timeout procedure was done. After adequate anesthesia was assured, an incision was made through the previous incision. The incision was carried down through the subcutaneous tissue to the fascia. The fascia was scored the midline and extended bilaterally. There were very dense adhsions of the fascia. I elevated the fascia with Kocher clamps and separated them from the underlying fascia as much as the adhesions allowed.  I was finally able to find a window into the peritoneum. I was finally able to see the uterus and noted that the bladder was somewhat adherent to the uterus.I created a bladder flap.  Peritoneal incision was extended bilaterally with the Bovie as much as the adhesions allowed. The opening allowed by the adhesions for  the delivery of the baby was deemed adequate but I called to have a Kiwi vacuum available. The bladder blade was placed. A transverse incision was made on the well-developed lower uterine segment. The uterine incision was extended with traction on each side. Amniotomy was performed with a hemostat. Clear fluid was noted. The baby was delivered from a vertex presentation with the aid of the Kiwi (flat). The pressure was in the green zone and only 1 pull was required. The mouth and nostrils were suctioned prior to delivery of the shoulders.  The baby's cord was clamped and cut and was transferred to the NICU personnel for routine care after one minute of delayed cord clamping.  The placenta was delivered intact with traction. The uterus was left in situ and the interior was cleaned with a dry lap sponge. The uterine incision was closed with 0 Vicryl running locking suture in two layers with the second layer imbricating the first.  Excellent hemostasis was noted. By tilting the uterus each side was able to visualize the adnexa, and they were normal.  The rectus fascia and rectus muscles were noted be hemostatic as well. The fascia was closed with a #1 PDS loop in a running nonlocking fashion. No defects were palpable. The subcutaneous tissue was irrigated, clean, and dried. A subcuticular closure was done with a 3-0 Vicryl suture. Steri-Strips are placed. Excellent cosmetic results were obtained. She was taken to the recovery room in stable condition. She tolerated the procedure well.

## 2024-09-17 NOTE — Transfer of Care (Signed)
 Immediate Anesthesia Transfer of Care Note  Patient: Sharon Gardner  Procedure(s) Performed: CESAREAN DELIVERY  Patient Location: Mother/Baby  Anesthesia Type:Spinal  Level of Consciousness: awake, alert , and oriented  Airway & Oxygen Therapy: Patient Spontanous Breathing  Post-op Assessment: Report given to RN and Post -op Vital signs reviewed and stable  Post vital signs: Reviewed and stable  Last Vitals:  Vitals Value Taken Time  BP 121/68 09/17/24 23:23  Temp 36.4 C 09/17/24 23:23  Pulse 81 09/17/24 23:23  Resp 18 09/17/24 23:23  SpO2 98 % 09/17/24 23:23    Last Pain:  Vitals:   09/17/24 2323  TempSrc: Oral  PainSc: 0-No pain         Complications: No notable events documented.

## 2024-09-18 ENCOUNTER — Encounter: Payer: Self-pay | Admitting: Obstetrics & Gynecology

## 2024-09-18 ENCOUNTER — Encounter: Admitting: Certified Nurse Midwife

## 2024-09-18 ENCOUNTER — Other Ambulatory Visit

## 2024-09-18 DIAGNOSIS — O1490 Unspecified pre-eclampsia, unspecified trimester: Secondary | ICD-10-CM | POA: Diagnosis present

## 2024-09-18 LAB — CBC
HCT: 28.6 % — ABNORMAL LOW (ref 36.0–46.0)
Hemoglobin: 10.2 g/dL — ABNORMAL LOW (ref 12.0–15.0)
MCH: 28.7 pg (ref 26.0–34.0)
MCHC: 35.7 g/dL (ref 30.0–36.0)
MCV: 80.3 fL (ref 80.0–100.0)
Platelets: 202 K/uL (ref 150–400)
RBC: 3.56 MIL/uL — ABNORMAL LOW (ref 3.87–5.11)
RDW: 12.6 % (ref 11.5–15.5)
WBC: 16.2 K/uL — ABNORMAL HIGH (ref 4.0–10.5)
nRBC: 0 % (ref 0.0–0.2)

## 2024-09-18 LAB — SYPHILIS: RPR W/REFLEX TO RPR TITER AND TREPONEMAL ANTIBODIES, TRADITIONAL SCREENING AND DIAGNOSIS ALGORITHM: RPR Ser Ql: NONREACTIVE

## 2024-09-18 MED ORDER — SIMETHICONE 80 MG PO CHEW: 80.0000 mg | CHEWABLE_TABLET | ORAL | Status: DC | PRN

## 2024-09-18 MED ORDER — SCOPOLAMINE 1 MG/3DAYS TD PT72: 1.0000 | MEDICATED_PATCH | Freq: Once | TRANSDERMAL | Status: DC

## 2024-09-18 MED ORDER — ACETAMINOPHEN 500 MG PO TABS: 1000.0000 mg | ORAL_TABLET | Freq: Four times a day (QID) | ORAL | Status: DC

## 2024-09-18 MED ORDER — DIPHENHYDRAMINE HCL 25 MG PO CAPS: 25.0000 mg | ORAL_CAPSULE | ORAL | Status: DC | PRN

## 2024-09-18 MED ORDER — PRENATAL MULTIVITAMIN CH
1.0000 | ORAL_TABLET | Freq: Every evening | ORAL | Status: DC
  Administered 2024-09-18 – 2024-09-19 (×2): 1 via ORAL
  Filled 2024-09-18 (×3): qty 1

## 2024-09-18 MED ORDER — DIBUCAINE (PERIANAL) 1 % EX OINT: 1.0000 | TOPICAL_OINTMENT | CUTANEOUS | Status: DC | PRN

## 2024-09-18 MED ORDER — MENTHOL 3 MG MT LOZG: 1.0000 | LOZENGE | OROMUCOSAL | Status: DC | PRN

## 2024-09-18 MED ORDER — NALOXONE HCL 0.4 MG/ML IJ SOLN: 0.4000 mg | INTRAMUSCULAR | Status: DC | PRN

## 2024-09-18 MED ORDER — CALCIUM GLUCONATE 10 % IV SOLN
INTRAVENOUS | Status: AC
  Filled 2024-09-18: qty 10

## 2024-09-18 MED ORDER — IBUPROFEN 600 MG PO TABS
600.0000 mg | ORAL_TABLET | Freq: Four times a day (QID) | ORAL | Status: DC
  Administered 2024-09-18 – 2024-09-19 (×5): 600 mg via ORAL
  Filled 2024-09-18 (×6): qty 1

## 2024-09-18 MED ORDER — COCONUT OIL OIL
1.0000 | TOPICAL_OIL | Status: DC | PRN
  Filled 2024-09-18: qty 7.5

## 2024-09-18 MED ORDER — ONDANSETRON HCL 4 MG/2ML IJ SOLN: 4.0000 mg | Freq: Three times a day (TID) | INTRAMUSCULAR | Status: DC | PRN

## 2024-09-18 MED ORDER — 0.9 % SODIUM CHLORIDE (POUR BTL) OPTIME
TOPICAL | Status: DC | PRN
  Administered 2024-09-17: 1000 mL

## 2024-09-18 MED ORDER — OXYTOCIN-SODIUM CHLORIDE 30-0.9 UT/500ML-% IV SOLN
2.5000 [IU]/h | INTRAVENOUS | Status: AC
  Administered 2024-09-17 – 2024-09-18 (×2): 2.5 [IU]/h via INTRAVENOUS
  Filled 2024-09-18: qty 500

## 2024-09-18 MED ORDER — DIPHENHYDRAMINE HCL 50 MG/ML IJ SOLN: 12.5000 mg | INTRAMUSCULAR | Status: DC | PRN

## 2024-09-18 MED ORDER — SENNOSIDES-DOCUSATE SODIUM 8.6-50 MG PO TABS
2.0000 | ORAL_TABLET | Freq: Every day | ORAL | Status: DC
  Administered 2024-09-19: 2 via ORAL
  Filled 2024-09-18: qty 2

## 2024-09-18 MED ORDER — SODIUM CHLORIDE 0.9% FLUSH: 3.0000 mL | INTRAVENOUS | Status: DC | PRN

## 2024-09-18 MED ORDER — FERROUS SULFATE 325 (65 FE) MG PO TABS
325.0000 mg | ORAL_TABLET | Freq: Every day | ORAL | Status: DC
  Administered 2024-09-19: 325 mg via ORAL
  Filled 2024-09-18: qty 1

## 2024-09-18 MED ORDER — KETOROLAC TROMETHAMINE 30 MG/ML IJ SOLN: 30.0000 mg | Freq: Four times a day (QID) | INTRAMUSCULAR | Status: AC | PRN

## 2024-09-18 MED ORDER — ACETAMINOPHEN 500 MG PO TABS
1000.0000 mg | ORAL_TABLET | Freq: Four times a day (QID) | ORAL | Status: AC
  Administered 2024-09-18 (×3): 1000 mg via ORAL
  Filled 2024-09-18 (×3): qty 2

## 2024-09-18 MED ORDER — OXYCODONE HCL 5 MG PO TABS: 5.0000 mg | ORAL_TABLET | Freq: Four times a day (QID) | ORAL | Status: DC | PRN

## 2024-09-18 MED ORDER — OXYCODONE HCL 5 MG PO TABS: 5.0000 mg | ORAL_TABLET | ORAL | Status: DC | PRN

## 2024-09-18 MED ORDER — SODIUM CHLORIDE 0.9 % IV SOLN
12.5000 mg | Freq: Once | INTRAVENOUS | Status: AC
  Administered 2024-09-18: 12.5 mg via INTRAVENOUS
  Filled 2024-09-18: qty 12.5

## 2024-09-18 MED ORDER — SODIUM CHLORIDE 0.9 % IV SOLN: 12.5000 mg | Freq: Once | INTRAVENOUS | Status: DC

## 2024-09-18 MED ORDER — BUPIVACAINE HCL 0.5 % IJ SOLN
INTRAMUSCULAR | Status: DC | PRN
  Administered 2024-09-17: 30 mL

## 2024-09-18 MED ORDER — SIMETHICONE 80 MG PO CHEW
80.0000 mg | CHEWABLE_TABLET | Freq: Three times a day (TID) | ORAL | Status: DC
  Administered 2024-09-18 – 2024-09-19 (×5): 80 mg via ORAL
  Filled 2024-09-18 (×5): qty 1

## 2024-09-18 MED ORDER — WITCH HAZEL-GLYCERIN EX PADS: 1.0000 | MEDICATED_PAD | CUTANEOUS | Status: DC | PRN

## 2024-09-18 MED ORDER — DIPHENHYDRAMINE HCL 25 MG PO CAPS: 25.0000 mg | ORAL_CAPSULE | Freq: Four times a day (QID) | ORAL | Status: DC | PRN

## 2024-09-18 MED ORDER — ZOLPIDEM TARTRATE 5 MG PO TABS: 5.0000 mg | ORAL_TABLET | Freq: Every evening | ORAL | Status: DC | PRN

## 2024-09-18 MED ORDER — PRENATAL MULTIVITAMIN CH: 1.0000 | ORAL_TABLET | Freq: Every day | ORAL | Status: DC

## 2024-09-18 MED ORDER — NALOXONE HCL 4 MG/10ML IJ SOLN: 1.0000 ug/kg/h | INTRAVENOUS | Status: DC | PRN

## 2024-09-18 NOTE — Progress Notes (Signed)
 Obstetric Postpartum/PostOperative Daily Progress Note Subjective:  33 y.o. [id number] post-operative day # 1 status post repeat cesarean delivery for severe pre-eclampsia.  She is ambulating, is tolerating po, is voiding spontaneously.  Her pain is well controlled on PO pain medications. Her lochia is less than menses.  She is currently receiving magnesium  sulfate for seizure prophylaxis due to be turned on off at 2230.    Medications SCHEDULED MEDICATIONS   acetaminophen   1,000 mg Oral Q6H   calcium gluconate       ibuprofen   600 mg Oral Q6H   prenatal multivitamin  1 tablet Oral QPM   scopolamine   1 patch Transdermal Once   scopolamine   1 patch Transdermal Q72H   scopolamine        [START ON 09/19/2024] senna-docusate  2 tablet Oral Daily   simethicone   80 mg Oral TID PC    MEDICATION INFUSIONS   lactated ringers  10 mL/hr at 09/18/24 0630   magnesium  sulfate 2 g/hr (09/18/24 0630)   naloxone  HCl (NARCAN ) 2 mg in dextrose  5 % 250 mL infusion     oxytocin  2.5 Units/hr (09/18/24 0630)    PRN MEDICATIONS  acetaminophen , calcium gluconate, coconut oil, diphenhydrAMINE  **OR** diphenhydrAMINE , labetalol  **AND** labetalol  **AND** labetalol  **AND** hydrALAZINE  **AND** Measure blood pressure, ketorolac  **OR** ketorolac , menthol , naloxone  **AND** sodium chloride  flush, naloxone  HCl (NARCAN ) 2 mg in dextrose  5 % 250 mL infusion, ondansetron  (ZOFRAN ) IV, oxyCODONE , oxyCODONE , scopolamine , simethicone , zolpidem     Objective:   Vitals:   09/18/24 0600 09/18/24 0630 09/18/24 0700 09/18/24 0900  BP: 121/76  126/76 121/70  Pulse: 81  87 78  Resp:  18    Temp:      TempSrc:      SpO2: 97%  96% 94%    Current Vital Signs 24h Vital Sign Ranges  T 97.9 F (36.6 C) Temp  Avg: 97.9 F (36.6 C)  Min: 97.5 F (36.4 C)  Max: 98.1 F (36.7 C)  BP 121/70 BP  Min: 115/75  Max: 168/99  HR 78 Pulse  Avg: 86.1  Min: 72  Max: 111  RR 18 Resp  Avg: 18.1  Min: 14  Max: 24  SaO2 94 % Room Air SpO2  Avg:  95.8 %  Min: 94 %  Max: 98 %       24 Hour I/O Current Shift I/O  Time Ins Outs 12/29 0701 - 12/30 0700 In: 2288.7 [P.O.:340; I.V.:1798.7] Out: 1445 [Urine:810] 12/30 0701 - 12/30 1900 In: 200 [P.O.:200] Out: 260 [Urine:250]  General: NAD Pulmonary: no increased work of breathing Abdomen: non-distended, non-tender, fundus firm at level of umbilicus Inc: Clean/dry/intact Extremities: no edema, no erythema, no tenderness  Labs:  Recent Labs  Lab 09/17/24 1745 09/17/24 2104 09/18/24 0743  WBC 10.2 11.3* 16.2*  HGB 11.0* 11.0* 10.2*  HCT 31.1* 31.8* 28.6*  PLT 182 178 202     Assessment:   33 y.o. [id number] postoperative day # 1 status post repeat cesarean section  Plan:  1) Acute blood loss anemia - hemodynamically stable and asymptomatic - po ferrous sulfate  2) AB POS / Rubella 6.39 (07/15 1511)/ Varicella Immune  3) TDAP status given prenatally  4) breast /Contraception = unsure  5) Will watch blood pressures overnight once magnesium  sulfate is discontinued.   Damien PARSLEY, CNM

## 2024-09-18 NOTE — Lactation Note (Signed)
 This note was copied from a baby's chart. Lactation Consultation Note  Patient Name: Sharon Gardner Date: 09/18/2024 Age:34 hours Reason for consult: Follow-up assessment;Mother's request;Breastfeeding assistance   Maternal Data Has patient been taught Hand Expression?: Yes Does the patient have breastfeeding experience prior to this delivery?: Yes How long did the patient breastfeed?: 13 months for both  LC called to room to assist patient w/ a feeding at the breast.  Mom attempting to latch infant upon entry into room.  Feeding Mother's Current Feeding Choice: Breast Milk (Currently - Breast/formula because she is a LPI) Nipple Type: Dr. Jonna Galli  Scnetx assisted with arousal of infant at the breast.  And syringe fed infant .4ml of colostrum.  After about 5- 10 minutes infant did no active eating at the breast. Attempted to suck here and there.  Mom then pace bottle fed infant formula in a Dr. Orlinda bottle.   Lactation Tools Discussed/Used Tools: Pump Breast pump type: Double-Electric Breast Pump Pump Education: Setup, frequency, and cleaning;Milk Storage Reason for Pumping: LPI Pumping frequency: 8x w/in a 24hr period  Interventions Interventions: Education  Discharge Discharge Education: Engorgement and breast care Pump: Personal;DEBP WIC Program: No  Consult Status Consult Status: Follow-up Follow-up type: In-patient    Nile Prisk S Layn Kye 09/18/2024, 1:41 PM

## 2024-09-18 NOTE — Lactation Note (Signed)
 This note was copied from a baby's chart. Lactation Consultation Note  Patient Name: Sharon Gardner Unijb'd Date: 09/18/2024 Age:34 hours Reason for consult: L&D Initial assessment;Late-preterm 34-36.6wks;Breastfeeding assistance   Maternal Data Has patient been taught Hand Expression?: Yes Does the patient have breastfeeding experience prior to this delivery?: Yes How long did the patient breastfeed?: 13 months for both  Initial assessment in L&D w/ a 11hr old baby Sharon Chloe and parents.  This is a [redacted]w[redacted]d old infant and parents 3rd baby.  An elective c-section.  Patient currently in L&D on magnesium .  Mom stated that she breastfed her other 2 children for 13 months with no issues.  She has a breastpump at home.    Feeding Mother's Current Feeding Choice: Breast Milk  At 11am  LC assisted patient w/ a feeding session at the breast. Infant did not respond or wake for a feeding.  Mom did some hand expression and drops of milk were expressed.  This was fed back to infant.  After a couple of minutes w/ an attempted breastfeeding session, infant was bottle fed by OT.     Lactation Tools Discussed/Used Tools: Pump Breast pump type: Double-Electric Breast Pump Pump Education: Setup, frequency, and cleaning;Milk Storage Reason for Pumping: LPI Pumping frequency: 8x w/in a 24hr period  A DEBP was set up in the L&D room, and patient got mom started pumping.  After 15 minutes about 1ml of colostrum was expressed.  Mom is using a 24mm flange on the rt breast and a 21mm flange on the left breast.   Interventions Interventions: Breast feeding basics reviewed;DEBP;Education;Pace feeding;Infant Driven Feeding Algorithm education  LC provided education on the following;  milk production expectations, hunger cues, day 1/2 wet/dirty diapers, hand expression, and arousing infant for a feeding.  Lactation informed patient of feeding infant at least 8 or more times w/in a 24hr period but not  exceeding 3hrs. Patient verbalized understanding.   LC provided education on LPI behavior and what to expect over the next couple of days.  LC also discussed the LPT infant protocol.   Discharge Discharge Education: Engorgement and breast care Pump: Personal;DEBP WIC Program: No  Consult Status Consult Status: Follow-up Follow-up type: In-patient    [name] 09/18/2024, 11:36 AM

## 2024-09-19 ENCOUNTER — Inpatient Hospital Stay: Admission: RE | Admit: 2024-09-19 | Source: Ambulatory Visit

## 2024-09-19 ENCOUNTER — Encounter: Payer: Self-pay | Admitting: Obstetrics & Gynecology

## 2024-09-19 LAB — CBC
HCT: 27.3 % — ABNORMAL LOW (ref 36.0–46.0)
Hemoglobin: 9.6 g/dL — ABNORMAL LOW (ref 12.0–15.0)
MCH: 28.6 pg (ref 26.0–34.0)
MCHC: 35.2 g/dL (ref 30.0–36.0)
MCV: 81.3 fL (ref 80.0–100.0)
Platelets: 204 K/uL (ref 150–400)
RBC: 3.36 MIL/uL — ABNORMAL LOW (ref 3.87–5.11)
RDW: 12.9 % (ref 11.5–15.5)
WBC: 11.6 K/uL — ABNORMAL HIGH (ref 4.0–10.5)
nRBC: 0 % (ref 0.0–0.2)

## 2024-09-19 LAB — COMPREHENSIVE METABOLIC PANEL WITH GFR
ALT: <5 U/L (ref 0–44)
AST: 20 U/L (ref 15–41)
Albumin: 3.1 g/dL — ABNORMAL LOW (ref 3.5–5.0)
Alkaline Phosphatase: 175 U/L — ABNORMAL HIGH (ref 38–126)
Anion gap: 10 (ref 5–15)
BUN: 6 mg/dL (ref 6–20)
CO2: 23 mmol/L (ref 22–32)
Calcium: 7.8 mg/dL — ABNORMAL LOW (ref 8.9–10.3)
Chloride: 104 mmol/L (ref 98–111)
Creatinine, Ser: 0.73 mg/dL (ref 0.44–1.00)
GFR, Estimated: >60 mL/min
Glucose, Bld: 118 mg/dL — ABNORMAL HIGH (ref 70–99)
Potassium: 3 mmol/L — ABNORMAL LOW (ref 3.5–5.1)
Sodium: 136 mmol/L (ref 135–145)
Total Bilirubin: 0.2 mg/dL (ref 0.0–1.2)
Total Protein: 5.8 g/dL — ABNORMAL LOW (ref 6.5–8.1)

## 2024-09-19 LAB — BILE ACIDS, TOTAL: Bile Acids Total: 6.6 umol/L (ref 0.0–10.0)

## 2024-09-19 MED ORDER — FERROUS SULFATE 325 (65 FE) MG PO TBEC: 325.0000 mg | DELAYED_RELEASE_TABLET | ORAL | 0 refills | Status: AC

## 2024-09-19 NOTE — Progress Notes (Addendum)
 All doctor's discharge instructions reviewed with patient; she verbalized understanding of same; copy given. Patient discharged to home with infant. Patient left 3rd floor with infant in her arms via wheelchair accompanied by Camelia Silvan RN.

## 2024-09-19 NOTE — Anesthesia Postprocedure Evaluation (Signed)
"   Anesthesia Post Note  Patient: Sharon Gardner  Procedure(s) Performed: CESAREAN DELIVERY  Patient location during evaluation: Mother Baby Anesthesia Type: Spinal Level of consciousness: awake and alert Pain management: satisfactory to patient Vital Signs Assessment: post-procedure vital signs reviewed and stable Respiratory status: spontaneous breathing Cardiovascular status: stable Postop Assessment: patient able to bend at knees, no apparent nausea or vomiting and adequate PO intake Anesthetic complications: no Comments: Recent foley removal, awaiting first void.   No notable events documented.   Last Vitals:  Vitals:   09/19/24 0045 09/19/24 0329  BP: (!) 141/87 135/83  Pulse: 88 79  Resp: 17 20  Temp: 36.4 C (!) 36.4 C  SpO2: 98% 97%    Last Pain:  Vitals:   09/19/24 0329  TempSrc: Oral  PainSc:                  [name]      "

## 2024-09-19 NOTE — Progress Notes (Signed)
 Informed by Care Coordinator that Sharon Gardner is planning on checking out AMA. I came to the bedside discuss recommendation for remaining inpatient for Sharon Gardner d/t preeclampsia with severe features requiring magnesium  and preterm cesarean birth. Sharon Gardner's BPs have mostly been normotensive since delivery, with the exception of 2 MRBPs. She denies HA, visual changes, and epigastric pain. Her CBC and CMP are stable. Her pain is well-controlled and her incision is well-approximated. We have frankly discussed the risks of preeclampsia, including seizure and stroke, and Sharon Gardner and her husband verbalize understanding. They state that they live 7 minutes from the hospital and have access to transportation to return if needed. They are agreeable to coming to the office for a BP check on Friday. We have reviewed the s/s that should prompt an expedient return to the hospital. Although I would recommend continuing observation overnight, I think it is acceptable to discharge this evening with strict return precautions and close outpatient follow-up.   Sharon Moe M Marnee Gardner, CNM

## 2024-09-19 NOTE — Progress Notes (Addendum)
 Mother updated on plan of care regarding d/c order and instructions to return to [facility] office 09/21/2024 for BP check. Discussion was had by care coordinator with parents regarding their care and experience with this birth to make sure that didn't spark the AMA discussion. Parents assured me that they have received great care with the birth and they  just want to go home and be with their other children and this is our third baby and mom and baby are doing well.

## 2024-09-19 NOTE — Progress Notes (Addendum)
 Provider to come see pt and discuss plan of care including D/C order and requesting pt to return to [facility] office 09/21/2024 for BP recheck.

## 2024-09-19 NOTE — Progress Notes (Signed)
 Subjective: Postpartum Day 2: Cesarean Delivery  Sharon Gardner is feeling well overall. She is ambulating, voiding, and tolerating POs without difficulty. Her pain is well-controlled and her bleeding is WNL. Her mood is stable. She is breastfeeding and supplementing with formula. Magnesium  was discontinued last night at 2230. Most BPs have been normal but she has had 2 MRBPs. She denies HA, visual changes, and epigastric pain.  Baby is not being discharged today.  Objective: Vital signs in last 24 hours: Temp:  [97.5 F (36.4 C)-98.9 F (37.2 C)] 98.9 F (37.2 C) (12/31 1152) Pulse Rate:  [76-97] 76 (12/31 1152) Resp:  [14-20] 18 (12/31 1152) BP: (104-141)/(72-89) 140/88 (12/31 1152) SpO2:  [95 %-99 %] 96 % (12/31 1152) Weight:  [87.8 kg] 87.8 kg (12/31 0700)  Physical Exam:  General: alert, cooperative, and appears stated age Lochia: appropriate Uterine Fundus: firm Incision: Pressure dressing C/D/I DVT Evaluation: No evidence of DVT seen on physical exam.  Recent Labs    09/17/24 2104 09/18/24 0743  HGB 11.0* 10.2*  HCT 31.8* 28.6*    Assessment/Plan: Status post Cesarean section. Doing well postoperatively.  Continue monitoring BPs Routine PP care CBC/CMP today Anticipate d/c in AM  Eleanor CHRISTELLA Canny, CNM 09/19/2024, 12:07 PM

## 2024-09-19 NOTE — Lactation Note (Signed)
 This note was copied from a baby's chart. Lactation Consultation Note  Patient Name: Girl Avigayil Ton Unijb'd Date: 09/19/2024 Age:33 years Reason for consult: Follow-up assessment;Late-preterm 34-36.6wks   Maternal Data This is mom's 3rd baby, elective C/S. Mom with history of PIH, anxiety, and severe pre-eclampsia with this baby.  On follow-up today mom is following LPT plan. Per mom she  has been an over producer with her other children and her last pump session she pumped a full feeding for the baby(15 ml's). Mom and dad have concerns this baby has a tongue tie. Their 1st baby had a tongue tie that was clipped at 6 weeks and per parents the baby fed well after the clipping. Mom describes this LPT baby latches and takes 2 sucks and detaches. Baby is asleep in mom's arms at this time. Has patient been taught Hand Expression?: Yes Does the patient have breastfeeding experience prior to this delivery?: Yes How long did the patient breastfeed?: 13 months, mom breastfed 2 previous children  Feeding Mother's Current Feeding Choice: Breast Milk and Formula Nipple Type: Dr. Jonna Galli Discussed with parents characteristics of LPT infant and potential for inconsistent breastfeeding until baby's original due date. Per dad once baby can have tongue tie clipped he feels baby will breastfeed better as that is what happened with his 1st baby. As baby is asleep LC could not assess parent's concern of tongue tie. LC number on white board for mom to call when baby is awake. Discussed with parents if baby has a tongue tie and clipping is recommended the baby very likely will continue to have inconsistent breastfeeding behaviors related to being late preterm. Discussed importance of consistent pumping in the early post partum period. Parents verbalized understanding of information provided.    Lactation Tools Discussed/Used Tools: Pump Pumping frequency: 8 times/24 hours Pumped volume:  (Per mom  she was able to pump 15 ml's at the previous pump session)  Interventions Interventions: Education LC number on white board.  Discharge Pump: Personal;DEBP (Medela pump)  Consult Status Consult Status: Follow-up Date: 09/20/24 Follow-up type: In-patient  Update provided to care nurse  Avelina DELENA Gaskins 09/19/2024, 6:19 PM

## 2024-09-19 NOTE — Progress Notes (Signed)
 Provider notified that the pt would like to leave AMA. RN requesting provider review case and place D/C order.

## 2024-09-19 NOTE — Discharge Summary (Signed)
 "    Postpartum Discharge Summary  Date of Service updated 09/19/2024      Patient Name: Sharon Gardner DOB: 1990-11-01 MRN: [id number]  Date of admission: 09/17/2024 Delivery date:09/17/2024 Delivering provider: STARLA HARLAND BROCKS Date of discharge: 09/19/2024  Admitting diagnosis: Elevated blood pressure affecting pregnancy in third trimester, antepartum [O16.3] History of cesarean delivery, currently pregnant [O34.219] Pre-eclampsia [O14.90] Intrauterine pregnancy: [redacted]w[redacted]d     Secondary diagnosis:  Principal Problem:   Elevated blood pressure affecting pregnancy in third trimester, antepartum Active Problems:   History of cesarean delivery, currently pregnant   Pre-eclampsia  Additional problems: None    Discharge diagnosis: Preterm Pregnancy Delivered and Preeclampsia (severe)                                              Post partum procedures:Magnesium  sulfate Augmentation: N/A Complications: None  Hospital course: Sceduled C/S   33 y.o. yo [id number] at [redacted]w[redacted]d was admitted to the hospital 09/17/2024 for scheduled cesarean section with the following indication:Elective Repeat and preeclampsia.Delivery details are as follows:  Membrane Rupture Time/Date: 10:43 PM,09/17/2024  Delivery Method:C-Section, Low Transverse Operative Delivery:N/A Details of operation can be found in separate operative note.  Patient had an uncomplicated postpartum.  She is ambulating, tolerating a regular diet, passing flatus, and urinating well. Patient is discharged home in stable condition on  09/19/2024        Newborn Data: Birth date:09/17/2024 Birth time:10:43 PM Gender:Female Living status:Living Apgars:8 ,9  Weight:2860 g    Magnesium  Sulfate received: Yes: Seizure prophylaxis BMZ received: No Rhophylac:N/A MMR:N/A T-DaP:Given prenatally Flu: Yes RSV Vaccine received: Yes Transfusion:No Immunizations administered: Immunization History  Administered Date(s) Administered    sv,  Bivalent, Protein Subunit Rsvpref,pf (Abrysvo) 08/28/2024   Influenza, Seasonal, Injecte, Preservative Fre 05/29/2024   Influenza,inj,Quad PF,6+ Mos 07/30/2016, 08/16/2017, 09/17/2019   Moderna Sars-Covid-2 Vaccination 11/02/2019, 11/30/2019   Tdap 12/03/2016, 02/11/2020, 07/24/2024    Physical exam  Vitals:   09/19/24 0812 09/19/24 1152 09/19/24 1556 09/19/24 1956  BP: 134/89 (!) 140/88 138/75 139/85  Pulse: 78 76 89 64  Resp: 18 18 18 18   Temp: 98.5 F (36.9 C) 98.9 F (37.2 C) 98.4 F (36.9 C) 98.4 F (36.9 C)  TempSrc: Oral Oral Oral Oral  SpO2: 98% 96% 98% 98%  Weight:      Height:       General: alert, cooperative, and no distress Lochia: appropriate Uterine Fundus: firm Incision: Healing well with no significant drainage, Dressing is clean, dry, and intact DVT Evaluation: No evidence of DVT seen on physical exam. Labs: Lab Results  Component Value Date   WBC 11.6 (H) 09/19/2024   HGB 9.6 (L) 09/19/2024   HCT 27.3 (L) 09/19/2024   MCV 81.3 09/19/2024   PLT 204 09/19/2024      Latest Ref Rng & Units 09/19/2024    1:10 PM  CMP  Glucose 70 - 99 mg/dL 881   BUN 6 - 20 mg/dL 6   Creatinine 9.55 - 8.99 mg/dL 9.26   Sodium 864 - 854 mmol/L 136   Potassium 3.5 - 5.1 mmol/L 3.0   Chloride 98 - 111 mmol/L 104   CO2 22 - 32 mmol/L 23   Calcium 8.9 - 10.3 mg/dL 7.8   Total Protein 6.5 - 8.1 g/dL 5.8   Total Bilirubin 0.0 - 1.2 mg/dL 0.2   Alkaline Phos  38 - 126 U/L 175   AST 15 - 41 U/L 20   ALT 0 - 44 U/L <5    Edinburgh Score:    09/19/2024   12:19 AM  Edinburgh Postnatal Depression Scale Screening Tool  I have been able to laugh and see the funny side of things. 0  I have looked forward with enjoyment to things. 0  I have blamed myself unnecessarily when things went wrong. 0  I have been anxious or worried for no good reason. 1  I have felt scared or panicky for no good reason. 0  Things have been getting on top of me. 0  I have been so unhappy that I  have had difficulty sleeping. 0  I have felt sad or miserable. 0  I have been so unhappy that I have been crying. 0  The thought of harming myself has occurred to me. 0  Edinburgh Postnatal Depression Scale Total 1      After visit meds:  Allergies as of 09/19/2024       Reactions   Amoxicillin Dermatitis, Rash        Medication List     STOP taking these medications    aspirin  EC 81 MG tablet       TAKE these medications    ferrous sulfate 325 (65 FE) MG EC tablet Take 1 tablet (325 mg total) by mouth every other day.   PRENATAL PO Take 1 tablet by mouth every evening.         Discharge home in stable condition Infant Feeding: Bottle and Breast Infant Disposition:home with mother Discharge instruction: per After Visit Summary and Postpartum booklet. Activity: Advance as tolerated. Pelvic rest for 6 weeks.  Diet: routine diet Anticipated Birth Control: Unsure Postpartum Appointment:BP check on Friday. Incision check in one week. Office visit in 6 weeks Additional Postpartum F/U:  Future Appointments: Future Appointments  Date Time Provider Department Center  10/02/2024  1:55 PM Leigh Sober, MD [facility] None   Follow up Visit:      09/19/2024 Eleanor CHRISTELLA Canny, CNM   "

## 2024-09-19 NOTE — Discharge Instructions (Signed)
 Per provider, please call the office Friday 09/21/2024 to obtain appointment for BP check.

## 2024-09-21 ENCOUNTER — Encounter: Payer: Self-pay | Admitting: Registered Nurse

## 2024-09-21 ENCOUNTER — Ambulatory Visit

## 2024-09-21 VITALS — BP 151/103 | HR 96 | Resp 16 | Ht 64.0 in | Wt 179.8 lb

## 2024-09-21 DIAGNOSIS — R030 Elevated blood-pressure reading, without diagnosis of hypertension: Secondary | ICD-10-CM

## 2024-09-21 DIAGNOSIS — Z0130 Encounter for examination of blood pressure without abnormal findings: Secondary | ICD-10-CM

## 2024-09-21 MED ORDER — FUROSEMIDE 20 MG PO TABS: 20.0000 mg | ORAL_TABLET | Freq: Every day | ORAL | 0 refills | Status: AC

## 2024-09-21 MED ORDER — POTASSIUM CHLORIDE CRYS ER 20 MEQ PO TBCR: 20.0000 meq | EXTENDED_RELEASE_TABLET | Freq: Two times a day (BID) | ORAL | 0 refills | Status: AC

## 2024-09-21 MED ORDER — NIFEDIPINE ER OSMOTIC RELEASE 30 MG PO TB24: 30.0000 mg | ORAL_TABLET | Freq: Every day | ORAL | 2 refills | Status: DC

## 2024-09-21 NOTE — Progress Notes (Addendum)
" ° ° °  NURSE VISIT NOTE  Subjective:    Patient ID: KAI RAILSBACK, female    DOB: Jun 27, 1991, 34 y.o.   MRN: [id number]  HPI  Patient is a 34 y.o. [phone] female who presents for BP check per order from Eleanor Canny, CNM.   Patient reports compliance with prescribed BP medications: No medications prescribed  Denies HA, visual changes, and epigastric pain. BP Readings from Last 3 Encounters:  09/21/24 (!) 152/98  09/19/24 139/85  09/17/24 (!) 156/94   Pulse Readings from Last 3 Encounters:  09/19/24 64  09/17/24 97  09/11/24 84    Objective:    LMP 01/07/2024 (Exact Date)   Assessment:   1. Blood pressure check      Plan:   Per Eleanor Canny, CNM:   Recommended evaluation in triage, but Laquanda reports that she is unable to go either now or later. We discussed the risks of preeclampsia in the postpartum period, including seizure, stroke, and death. Chandy is aware and declines going to the hospital. She is willing to do labs today and start blood pressure medication and return on Monday for a BP check. She was instructed to go promptly to the ED if she develops a severe headache, visual changes, epigastric pain, or has severe range BPs at home. Nifidipine 30 mg daily ordered. Lasix  20 mg once daily. Potassium Chloride  SA 20 mg. Take 1 tablet by mouth 2 times daily. Stat labs were ordered.   Patient was discharged to home, per Eleanor Canny, CNM Patient verbalized understanding of instructions.  Follow up on Monday, for BP check.   Camelia Fetters, CMA [facility]

## 2024-09-21 NOTE — Patient Instructions (Signed)
 High Blood Pressure During Pregnancy High blood pressure, or hypertension, is when the blood in your body moves with so much force that it could affect your health. It can cause problems for you and your baby. Three types of high blood pressure that can happen during pregnancy. They are: Chronic high blood pressure. This is when you've had high blood pressure for a while, even before getting pregnant. It doesn't go away after your baby is born. Gestational high blood pressure. This type starts after you're [redacted] weeks pregnant and usually goes away once your baby is born. Postpartum high blood pressure. This type is most common within 1 to 2 days after delivery, but it can also happen later -- even up to 12 weeks or more after pregnancy. It can be: High blood pressure that you had before your baby was born and continues after delivery. High blood pressure that starts after you've given birth. If your blood pressure gets really high, it's an emergency. You need to be treated right away. How does high blood pressure affect me? If you had blood pressure problems during pregnancy, you're more likely to get this condition after giving birth. High blood pressure can even happen 12 weeks or more after your baby is born. You may also: Get it when you are pregnant next time. Get it later in life. In some cases, this condition can cause serious problems, such as: Heart problems, such as stroke or heart attack. Injury to kidneys, lungs, or liver. Pre-eclampsia. HELLP syndrome. Seizures. Problems with the placenta. How does high blood pressure affect my baby? Your baby may: Be born early. Have a low birth weight. Have problems during labor. This may mean a C-section could be needed quickly. What are the risks for high blood pressure during pregnancy? You're more likely to get high blood pressure during pregnancy if: You had high blood pressure during a past pregnancy. You're overweight. You're 35 years  or older. You're pregnant for the first time. You're pregnant with more than one baby. You used a fertility method, such as IVF, to get pregnant. You have other problems, such as diabetes, kidney disease, or lupus. What can I do to lower my risk?  Keep a healthy weight. Eat a healthy diet. If you have long-term (chronic) conditions, have them treated before you become pregnant. How is this condition treated? Treatment depends on the type of high blood pressure you have and how serious it is. If you have high blood pressure, your health care provider may give you medicine to treat it and also to lessen risks to you and the baby. If you have very bad high blood pressure, you may need to stay in the hospital for treatment. If your condition gets worse, your baby may need to be born early. If you were taking medicine for your blood pressure before you got pregnant, talk with your provider. You may need to change the medicine during pregnancy if it is not safe for your baby. Follow these instructions at home: Eating and drinking Drink more fluids as told. Avoid caffeine. Lifestyle Do not use alcohol or drugs. Do not smoke, vape, or use nicotine or tobacco. Avoid stress as much as you can. Rest and get plenty of sleep. Get regular exercise. This can help to lower your blood pressure. Ask your health care provider what kinds of exercise are safe for you. General instructions Take your medicines only as told. Keep all follow-up visits. Your provider will check your blood pressure and  make sure that you and your baby are healthy. Contact a health care provider if: Your baby is not moving as much as usual. You feel very tired. You feel faint or dizzy. You throw up, or feel like you may throw up. You have cramping in your belly or have pain in your hips or lower back. You have spotting or bleeding, or you leak fluid from your vagina. Get help right away if: You have chest pain or trouble  breathing. You faint, have a seizure, or cannot think clearly. You have symptoms of serious problems, such as: A headache that doesn't go away when you take medicine. Very bad and sudden swelling of your face, hands, legs, or feet. Vision problems, such as: Seeing spots. Blurry vision. Being sensitive to light. These symptoms may be an emergency. Call 911 right away. Do not wait to see if the symptoms will go away. Do not drive yourself to the hospital. This information is not intended to replace advice given to you by your health care provider. Make sure you discuss any questions you have with your health care provider. Document Revised: 07/19/2023 Document Reviewed: 03/01/2023 Elsevier Patient Education  2024 [employer].

## 2024-09-22 LAB — CBC
Hematocrit: 28.1 % — ABNORMAL LOW (ref 34.0–46.6)
Hemoglobin: 9.5 g/dL — ABNORMAL LOW (ref 11.1–15.9)
MCH: 28.9 pg (ref 26.6–33.0)
MCHC: 33.8 g/dL (ref 31.5–35.7)
MCV: 85 fL (ref 79–97)
Platelets: 240 x10E3/uL (ref 150–450)
RBC: 3.29 x10E6/uL — ABNORMAL LOW (ref 3.77–5.28)
RDW: 12.9 % (ref 11.7–15.4)
WBC: 8.5 x10E3/uL (ref 3.4–10.8)

## 2024-09-22 LAB — COMPREHENSIVE METABOLIC PANEL WITH GFR
ALT: 30 IU/L (ref 0–32)
AST: 64 IU/L — ABNORMAL HIGH (ref 0–40)
Albumin: 3.4 g/dL — ABNORMAL LOW (ref 3.9–4.9)
Alkaline Phosphatase: 153 IU/L — ABNORMAL HIGH (ref 41–116)
BUN/Creatinine Ratio: 13 (ref 9–23)
BUN: 8 mg/dL (ref 6–20)
Bilirubin Total: 0.3 mg/dL (ref 0.0–1.2)
CO2: 23 mmol/L (ref 20–29)
Calcium: 8.2 mg/dL — ABNORMAL LOW (ref 8.7–10.2)
Chloride: 105 mmol/L (ref 96–106)
Creatinine, Ser: 0.63 mg/dL (ref 0.57–1.00)
Globulin, Total: 2.3 g/dL (ref 1.5–4.5)
Glucose: 86 mg/dL (ref 70–99)
Potassium: 3.4 mmol/L — ABNORMAL LOW (ref 3.5–5.2)
Sodium: 140 mmol/L (ref 134–144)
Total Protein: 5.7 g/dL — ABNORMAL LOW (ref 6.0–8.5)
eGFR: 119 mL/min/1.73

## 2024-09-24 ENCOUNTER — Encounter: Payer: Self-pay | Admitting: Obstetrics

## 2024-09-24 ENCOUNTER — Ambulatory Visit: Admitting: Obstetrics

## 2024-09-24 VITALS — BP 145/110 | HR 104 | Wt 169.6 lb

## 2024-09-24 DIAGNOSIS — O1413 Severe pre-eclampsia, third trimester: Secondary | ICD-10-CM

## 2024-09-24 DIAGNOSIS — Z4889 Encounter for other specified surgical aftercare: Secondary | ICD-10-CM

## 2024-09-24 MED ORDER — NIFEDIPINE ER OSMOTIC RELEASE 30 MG PO TB24: 30.0000 mg | ORAL_TABLET | Freq: Two times a day (BID) | ORAL | 1 refills | Status: AC

## 2024-09-24 NOTE — Patient Instructions (Signed)
 Postpartum Care After Cesarean Delivery The following information offers guidance on how to care for yourself from the time you deliver your baby to 6-12 weeks after delivery (postpartum period). Your health care provider may also give you more specific instructions. If you have problems or questions, contact your health care provider. How to care for yourself Perineal care     If your C-section (Cesarean section) was unplanned, and you were allowed to labor and push before delivery, you may have pain, swelling, and discomfort in the tissue between your vaginal opening and your anus (perineum). You may also have an incision in the tissue (episiotomy) or the tissue may have torn during delivery. Follow these instructions as told by your health care provider: Keep your perineum clean and dry. Use medicated pads and pain-relieving sprays and creams as directed. If you have an episiotomy or vaginal tear, check the area every day for signs of infection. Check for: Redness, swelling, or pain. Fluid or blood. Warmth. Pus or a bad smell. You may use a squirt bottle instead of wiping to clean the perineum area after you go to the bathroom. As you start healing, use the squirt bottle before wiping yourself. Make sure to wipe gently. To relieve pain caused by an episiotomy, vaginal tear, or hemorrhoids, try taking a warm sitz bath 2-3 times a day. Use a portable sitz bath that you can put over the toilet. Make sure the water covers your buttocks and perineum when you sit on the seat. Vaginal bleeding It is normal to have vaginal bleeding (lochia) after delivery. Wear a sanitary pad to absorb vaginal bleeding and discharge. During the first week after delivery, the amount and appearance of lochia is often similar to a menstrual period. Over the next few weeks, it will slowly decrease to a dry, yellow-brown discharge. For most women, lochia stops completely by 4-6 weeks after delivery. Vaginal bleeding can  vary from woman to woman. Change your sanitary pads frequently. Watch for any changes in your flow, such as: An increase in bleeding. A change in color. Large blood clots. If you pass a blood clot the size of an egg or larger, contact your health care provider. Do not use tampons or douches until your health care provider says it is safe. If you are not breastfeeding, your period should return 6-8 weeks after delivery. If you are breastfeeding, the time when your period returns varies based on whether or not you are breastfeeding exclusively. Breast care Within the first few days after delivery, your breasts may feel heavy, full, and uncomfortable (breast engorgement). You may also have milk leaking from your breasts. Your health care provider can suggest ways to help relieve breast discomfort. Breast engorgement should go away within a few days. If you are breastfeeding: Wear a bra that supports your breasts and fits you well. Keep your nipples clean and dry. Apply creams and ointments as told. You may need to use breast pads to absorb milk leakage. You may have uterine contractions every time you breastfeed for several weeks after delivery. Uterine contractions help your uterus return to its normal size. If you have any problems with breastfeeding, work with your health care provider or a Advertising copywriter. Take over-the-counter medicines as told by your health care provider to help with pain or discomfort. If you are not breastfeeding: Wear a well-fitting bra and use cold packs to help with swelling. Do not squeeze out (express) milk. This causes you to make more milk. Intimacy and  sexuality Ask your health care provider when you can engage in sexual activity. You are able to get pregnant after delivery, even if you have not had your period. If desired, talk with your health care provider about methods of family planning or birth control (contraception). Follow these instructions at  home: Medicines Take over-the-counter and prescription medicines only as told by your health care provider. If you were prescribed an antibiotic medicine, take it as told by your health care provider. Do not stop taking the antibiotic even if you start to feel better. Take your prenatal vitamins until your postpartum checkup or until your health care provider tells you it is okay to stop taking them. Activity Return to your normal activities as told by your health care provider. Ask your health care provider what activities are safe for you. You may have to avoid lifting. Ask your health care provider how much you can safely lift. If possible, have someone help you at home until you are able to do your usual activities yourself. Try to rest or take naps while your baby is sleeping. General instructions Drink enough fluid to keep your urine pale yellow. Do not drink alcohol, especially if you are breastfeeding. Do not use any products that contain nicotine or tobacco. These products include cigarettes, chewing tobacco, and vaping devices, such as e-cigarettes. If you need help quitting, ask your health care provider. Keep all follow-up visits. Your health care provider will check your healing after delivery and also check your blood pressure. Contact a health care provider if: You have: A fever. Breasts that are painful, hard, or turn red. Stopped breastfeeding and you have not had a menstrual period for 12 weeks after you stopped breastfeeding. Not breastfed at all and you have not had a menstrual period for 12 weeks after delivery. Trouble holding urine or keeping urine from leaking. A bad-smelling vaginal discharge. You have bleeding that soaks through one pad an hour or you have blood clots the size of an egg or larger. You have questions about caring for yourself or your baby. You feel unable to cope with the changes that a new baby brings to your life, and these feelings do not go away.  These include: Feeling unusually sad or worried. Having little or no interest in activities you used to enjoy. Get help right away if: You have: Chest pain or difficulty breathing. Pain, redness or swelling in an arm or leg. Severe pain or cramping in your abdomen. Thoughts about hurting yourself or your baby. You faint or have a seizure. You have any of the following symptoms and you were unable to reach your health care provider: A fever or other signs of infection. Bleeding that is soaking through one pad an hour or you have blood clots the size of an egg or larger. A severe headache that does not go away or you have a headache with vision changes. These symptoms may be an emergency. Get help right away. Call 911. Do not wait to see if the symptoms will go away. Do not drive yourself to the hospital. Get help right away if you feel like you may hurt yourself or others, or have thoughts about taking your own life. Go to your nearest emergency room or: Call 911. Call the National Suicide Prevention Lifeline at 438-797-6303 or 988. This is open 24 hours a day. Text the Crisis Text Line at 878 625 7513. This information is not intended to replace advice given to you by your health care  provider. Make sure you discuss any questions you have with your health care provider. Document Revised: 06/17/2022 Document Reviewed: 06/17/2021 Elsevier Patient Education  2024 [employer].

## 2024-09-24 NOTE — Progress Notes (Signed)
"  ° °  Postoperative Cesarean Incision Check Sharon Gardner is a 34 y.o. [phone] s/p repeat LTCS at [redacted]w[redacted]d for elective repeat in setting of preeclampsia with severe features (HA), POD#7, here today for incision check and blood pressure check.   Subjective: Pain is controlled without any medications. She denies fever, chills, nausea, and vomiting. Eating a regular diet without difficulty.  Is having regular bowel movements. Activity: normal activities of daily living. Bleeding is very light. She denies issues with her incision.    She is taking her medications daily.  She reports BP in the 140's/90's at home.  Last BP check on 09/21/24, BP in 150s/90s and was started on Nifedipine  30mg  XL daily and Lasix  20mg  x 5 days. Is taking both. PIH labs done that day were unremarkable with exception of AST elevated at 62. Denies any HA/vision changes/RUQ pain since discharge.   Objective: BP (!) 145/110   Pulse (!) 104   Wt 169 lb 9.6 oz (76.9 kg)   LMP 01/07/2024 (Exact Date)   Breastfeeding Yes   BMI 29.11 kg/m  Body mass index is 29.11 kg/m.  General:  alert and no distress  Abdomen: soft, bowel sounds active, non-tender  Incision:   healing well, no drainage, no erythema, no hernia, no seroma, no swelling, no dehiscence, incision well approximated; significant ecchymosis surrounding incision; steri-strips removed and replaced    Assessment/Plan: Sharon Gardner is a 34 y.o. [phone] s/p repeat LTCS  at [redacted]w[redacted]d for elective repeat and preeclampsia, POD#7, here today for incision check, healing well. No concerns with incision today, remaining steri-strips removed and replaced.  -Discussed at-home care, healing expectations, si/sx of infection.  -Call clinic if developing redness, discharge, or increasing pain. -Avoid vigorous scrubbing/washing of incision site; hygiene reviewed. -May trim back steri-strips if they peel; should fully remove after 1 week from today. -May resume driving and light  walking. Still no heavy lifting >10-12 lbs and pelvic rest advised until cleared at 6wk postpartum visit.  -If stooling regularly x 1-2 weeks, can take stool softener every other day x 1 week, taper as tolerated. -Note given for spouse's work re: patient not driving until 6wk PPV  Preeclampsia with severe features: HA  -BP today 145/110, repeat 143/97 -CBC/CMP done today; will not do urine, is bleeding and had proteinuria in hospital  -Increase Nifedipine  30XL to BID -- new Rx sent to [facility] on [address] -Aware if labs indicate LFTs worsening, may need 24h of Magnesium  -Will call with results  Return in about 5 weeks (around 10/29/2024) for 6 week postpartum.   Estil Mangle, DO [facility] "

## 2024-09-25 ENCOUNTER — Inpatient Hospital Stay: Admission: RE | Admit: 2024-09-25 | Source: Ambulatory Visit | Admitting: Obstetrics

## 2024-09-25 LAB — COMPREHENSIVE METABOLIC PANEL WITH GFR
ALT: 12 IU/L (ref 0–32)
AST: 14 IU/L (ref 0–40)
Albumin: 3.9 g/dL (ref 3.9–4.9)
Alkaline Phosphatase: 139 IU/L — ABNORMAL HIGH (ref 41–116)
BUN/Creatinine Ratio: 17 (ref 9–23)
BUN: 13 mg/dL (ref 6–20)
Bilirubin Total: 0.4 mg/dL (ref 0.0–1.2)
CO2: 20 mmol/L (ref 20–29)
Calcium: 9.1 mg/dL (ref 8.7–10.2)
Chloride: 105 mmol/L (ref 96–106)
Creatinine, Ser: 0.76 mg/dL (ref 0.57–1.00)
Globulin, Total: 2.6 g/dL (ref 1.5–4.5)
Glucose: 70 mg/dL (ref 70–99)
Potassium: 3.9 mmol/L (ref 3.5–5.2)
Sodium: 141 mmol/L (ref 134–144)
Total Protein: 6.5 g/dL (ref 6.0–8.5)
eGFR: 105 mL/min/1.73

## 2024-09-25 LAB — CBC
Hematocrit: 35.2 % (ref 34.0–46.6)
Hemoglobin: 11.8 g/dL (ref 11.1–15.9)
MCH: 28 pg (ref 26.6–33.0)
MCHC: 33.5 g/dL (ref 31.5–35.7)
MCV: 84 fL (ref 79–97)
Platelets: 383 x10E3/uL (ref 150–450)
RBC: 4.21 x10E6/uL (ref 3.77–5.28)
RDW: 12.7 % (ref 11.7–15.4)
WBC: 9.7 x10E3/uL (ref 3.4–10.8)

## 2024-09-26 ENCOUNTER — Ambulatory Visit: Payer: Self-pay | Admitting: Obstetrics

## 2024-09-30 ENCOUNTER — Encounter: Payer: Self-pay | Admitting: Obstetrics & Gynecology

## 2024-10-02 ENCOUNTER — Encounter: Admitting: Obstetrics

## 2024-10-30 ENCOUNTER — Ambulatory Visit: Admitting: Licensed Practical Nurse
